# Patient Record
Sex: Female | Born: 1975 | Marital: Married | Smoking: Never smoker
Health system: Southern US, Community
[De-identification: ages and names within clinical notes are randomized; demographics above are authoritative.]

## PROBLEM LIST (undated history)

## (undated) DIAGNOSIS — E1140 Type 2 diabetes mellitus with diabetic neuropathy, unspecified: Secondary | ICD-10-CM

## (undated) DIAGNOSIS — E559 Vitamin D deficiency, unspecified: Secondary | ICD-10-CM

## (undated) DIAGNOSIS — E781 Pure hyperglyceridemia: Secondary | ICD-10-CM

## (undated) DIAGNOSIS — H269 Unspecified cataract: Secondary | ICD-10-CM

## (undated) DIAGNOSIS — D649 Anemia, unspecified: Secondary | ICD-10-CM

## (undated) DIAGNOSIS — E119 Type 2 diabetes mellitus without complications: Secondary | ICD-10-CM

## (undated) HISTORY — DX: Unspecified cataract: H26.9

## (undated) HISTORY — DX: Pure hyperglyceridemia: E78.1

## (undated) HISTORY — DX: Vitamin D deficiency, unspecified: E55.9

## (undated) HISTORY — DX: Type 2 diabetes mellitus without complications: E11.9

## (undated) HISTORY — DX: Morbid (severe) obesity due to excess calories: E66.01

## (undated) HISTORY — DX: Type 2 diabetes mellitus with diabetic neuropathy, unspecified: E11.40

## (undated) HISTORY — DX: Anemia, unspecified: D64.9

---

## 2015-08-02 DIAGNOSIS — L02211 Cutaneous abscess of abdominal wall: Secondary | ICD-10-CM | POA: Diagnosis not present

## 2015-08-02 DIAGNOSIS — Z6830 Body mass index (BMI) 30.0-30.9, adult: Secondary | ICD-10-CM | POA: Diagnosis not present

## 2015-08-02 DIAGNOSIS — L0231 Cutaneous abscess of buttock: Secondary | ICD-10-CM | POA: Diagnosis not present

## 2015-10-31 DIAGNOSIS — Z794 Long term (current) use of insulin: Secondary | ICD-10-CM | POA: Diagnosis not present

## 2015-10-31 DIAGNOSIS — E559 Vitamin D deficiency, unspecified: Secondary | ICD-10-CM | POA: Diagnosis not present

## 2015-10-31 DIAGNOSIS — E1129 Type 2 diabetes mellitus with other diabetic kidney complication: Secondary | ICD-10-CM | POA: Diagnosis not present

## 2015-10-31 DIAGNOSIS — E1165 Type 2 diabetes mellitus with hyperglycemia: Secondary | ICD-10-CM | POA: Diagnosis not present

## 2015-10-31 DIAGNOSIS — R809 Proteinuria, unspecified: Secondary | ICD-10-CM | POA: Diagnosis not present

## 2015-10-31 DIAGNOSIS — E785 Hyperlipidemia, unspecified: Secondary | ICD-10-CM | POA: Diagnosis not present

## 2015-10-31 DIAGNOSIS — I10 Essential (primary) hypertension: Secondary | ICD-10-CM | POA: Diagnosis not present

## 2015-11-20 DIAGNOSIS — E113393 Type 2 diabetes mellitus with moderate nonproliferative diabetic retinopathy without macular edema, bilateral: Secondary | ICD-10-CM | POA: Diagnosis not present

## 2015-11-20 DIAGNOSIS — H524 Presbyopia: Secondary | ICD-10-CM | POA: Diagnosis not present

## 2015-12-11 DIAGNOSIS — E1165 Type 2 diabetes mellitus with hyperglycemia: Secondary | ICD-10-CM | POA: Diagnosis not present

## 2015-12-26 DIAGNOSIS — H4312 Vitreous hemorrhage, left eye: Secondary | ICD-10-CM | POA: Diagnosis not present

## 2015-12-27 DIAGNOSIS — Z23 Encounter for immunization: Secondary | ICD-10-CM | POA: Diagnosis not present

## 2016-01-09 DIAGNOSIS — E113591 Type 2 diabetes mellitus with proliferative diabetic retinopathy without macular edema, right eye: Secondary | ICD-10-CM | POA: Diagnosis not present

## 2016-03-12 DIAGNOSIS — Z1389 Encounter for screening for other disorder: Secondary | ICD-10-CM | POA: Diagnosis not present

## 2016-03-12 DIAGNOSIS — J208 Acute bronchitis due to other specified organisms: Secondary | ICD-10-CM | POA: Diagnosis not present

## 2016-03-12 DIAGNOSIS — E669 Obesity, unspecified: Secondary | ICD-10-CM | POA: Diagnosis not present

## 2016-03-12 DIAGNOSIS — E1165 Type 2 diabetes mellitus with hyperglycemia: Secondary | ICD-10-CM | POA: Diagnosis not present

## 2016-03-12 DIAGNOSIS — R300 Dysuria: Secondary | ICD-10-CM | POA: Diagnosis not present

## 2016-03-16 DIAGNOSIS — E1129 Type 2 diabetes mellitus with other diabetic kidney complication: Secondary | ICD-10-CM | POA: Diagnosis not present

## 2016-03-16 DIAGNOSIS — E785 Hyperlipidemia, unspecified: Secondary | ICD-10-CM | POA: Diagnosis not present

## 2016-03-16 DIAGNOSIS — E1165 Type 2 diabetes mellitus with hyperglycemia: Secondary | ICD-10-CM | POA: Diagnosis not present

## 2016-03-16 DIAGNOSIS — E559 Vitamin D deficiency, unspecified: Secondary | ICD-10-CM | POA: Diagnosis not present

## 2016-03-16 DIAGNOSIS — Z794 Long term (current) use of insulin: Secondary | ICD-10-CM | POA: Diagnosis not present

## 2016-03-16 DIAGNOSIS — R809 Proteinuria, unspecified: Secondary | ICD-10-CM | POA: Diagnosis not present

## 2016-03-18 DIAGNOSIS — E1165 Type 2 diabetes mellitus with hyperglycemia: Secondary | ICD-10-CM | POA: Diagnosis not present

## 2016-04-14 DIAGNOSIS — J45909 Unspecified asthma, uncomplicated: Secondary | ICD-10-CM | POA: Diagnosis not present

## 2016-04-14 DIAGNOSIS — J309 Allergic rhinitis, unspecified: Secondary | ICD-10-CM | POA: Diagnosis not present

## 2016-04-14 DIAGNOSIS — N393 Stress incontinence (female) (male): Secondary | ICD-10-CM | POA: Diagnosis not present

## 2016-04-14 DIAGNOSIS — R5383 Other fatigue: Secondary | ICD-10-CM | POA: Diagnosis not present

## 2016-04-23 DIAGNOSIS — Z794 Long term (current) use of insulin: Secondary | ICD-10-CM | POA: Diagnosis not present

## 2016-04-23 DIAGNOSIS — H2513 Age-related nuclear cataract, bilateral: Secondary | ICD-10-CM | POA: Diagnosis not present

## 2016-04-23 DIAGNOSIS — E113513 Type 2 diabetes mellitus with proliferative diabetic retinopathy with macular edema, bilateral: Secondary | ICD-10-CM | POA: Diagnosis not present

## 2016-04-23 DIAGNOSIS — Z9889 Other specified postprocedural states: Secondary | ICD-10-CM | POA: Diagnosis not present

## 2016-04-23 DIAGNOSIS — H4312 Vitreous hemorrhage, left eye: Secondary | ICD-10-CM | POA: Diagnosis not present

## 2016-06-05 DIAGNOSIS — E559 Vitamin D deficiency, unspecified: Secondary | ICD-10-CM | POA: Diagnosis not present

## 2016-06-05 DIAGNOSIS — H2513 Age-related nuclear cataract, bilateral: Secondary | ICD-10-CM | POA: Diagnosis not present

## 2016-06-05 DIAGNOSIS — Z888 Allergy status to other drugs, medicaments and biological substances status: Secondary | ICD-10-CM | POA: Diagnosis not present

## 2016-06-05 DIAGNOSIS — Z79899 Other long term (current) drug therapy: Secondary | ICD-10-CM | POA: Diagnosis not present

## 2016-06-05 DIAGNOSIS — E113513 Type 2 diabetes mellitus with proliferative diabetic retinopathy with macular edema, bilateral: Secondary | ICD-10-CM | POA: Diagnosis not present

## 2016-06-05 DIAGNOSIS — H4312 Vitreous hemorrhage, left eye: Secondary | ICD-10-CM | POA: Diagnosis not present

## 2016-06-05 DIAGNOSIS — E785 Hyperlipidemia, unspecified: Secondary | ICD-10-CM | POA: Diagnosis not present

## 2016-06-05 DIAGNOSIS — Z794 Long term (current) use of insulin: Secondary | ICD-10-CM | POA: Diagnosis not present

## 2016-06-05 DIAGNOSIS — E113312 Type 2 diabetes mellitus with moderate nonproliferative diabetic retinopathy with macular edema, left eye: Secondary | ICD-10-CM | POA: Diagnosis not present

## 2016-07-03 DIAGNOSIS — E1165 Type 2 diabetes mellitus with hyperglycemia: Secondary | ICD-10-CM | POA: Diagnosis not present

## 2016-07-03 DIAGNOSIS — E1129 Type 2 diabetes mellitus with other diabetic kidney complication: Secondary | ICD-10-CM | POA: Diagnosis not present

## 2016-07-09 DIAGNOSIS — E1165 Type 2 diabetes mellitus with hyperglycemia: Secondary | ICD-10-CM | POA: Diagnosis not present

## 2016-07-09 DIAGNOSIS — E1129 Type 2 diabetes mellitus with other diabetic kidney complication: Secondary | ICD-10-CM | POA: Diagnosis not present

## 2016-07-13 DIAGNOSIS — R809 Proteinuria, unspecified: Secondary | ICD-10-CM | POA: Diagnosis not present

## 2016-07-13 DIAGNOSIS — E1129 Type 2 diabetes mellitus with other diabetic kidney complication: Secondary | ICD-10-CM | POA: Diagnosis not present

## 2016-07-13 DIAGNOSIS — Z794 Long term (current) use of insulin: Secondary | ICD-10-CM | POA: Diagnosis not present

## 2016-07-13 DIAGNOSIS — E1165 Type 2 diabetes mellitus with hyperglycemia: Secondary | ICD-10-CM | POA: Diagnosis not present

## 2016-07-13 DIAGNOSIS — E785 Hyperlipidemia, unspecified: Secondary | ICD-10-CM | POA: Diagnosis not present

## 2016-07-13 DIAGNOSIS — I10 Essential (primary) hypertension: Secondary | ICD-10-CM | POA: Diagnosis not present

## 2016-07-20 DIAGNOSIS — H31003 Unspecified chorioretinal scars, bilateral: Secondary | ICD-10-CM | POA: Diagnosis not present

## 2016-07-20 DIAGNOSIS — E1136 Type 2 diabetes mellitus with diabetic cataract: Secondary | ICD-10-CM | POA: Diagnosis not present

## 2016-07-20 DIAGNOSIS — H4312 Vitreous hemorrhage, left eye: Secondary | ICD-10-CM | POA: Diagnosis not present

## 2016-07-20 DIAGNOSIS — E785 Hyperlipidemia, unspecified: Secondary | ICD-10-CM | POA: Diagnosis not present

## 2016-07-20 DIAGNOSIS — Z888 Allergy status to other drugs, medicaments and biological substances status: Secondary | ICD-10-CM | POA: Diagnosis not present

## 2016-07-20 DIAGNOSIS — E113513 Type 2 diabetes mellitus with proliferative diabetic retinopathy with macular edema, bilateral: Secondary | ICD-10-CM | POA: Diagnosis not present

## 2016-07-20 DIAGNOSIS — H35051 Retinal neovascularization, unspecified, right eye: Secondary | ICD-10-CM | POA: Diagnosis not present

## 2016-07-20 DIAGNOSIS — Z794 Long term (current) use of insulin: Secondary | ICD-10-CM | POA: Diagnosis not present

## 2016-07-20 DIAGNOSIS — E113512 Type 2 diabetes mellitus with proliferative diabetic retinopathy with macular edema, left eye: Secondary | ICD-10-CM | POA: Diagnosis not present

## 2016-07-20 DIAGNOSIS — E113511 Type 2 diabetes mellitus with proliferative diabetic retinopathy with macular edema, right eye: Secondary | ICD-10-CM | POA: Diagnosis not present

## 2016-08-17 DIAGNOSIS — H4312 Vitreous hemorrhage, left eye: Secondary | ICD-10-CM | POA: Diagnosis not present

## 2016-08-17 DIAGNOSIS — Z794 Long term (current) use of insulin: Secondary | ICD-10-CM | POA: Diagnosis not present

## 2016-08-17 DIAGNOSIS — E113511 Type 2 diabetes mellitus with proliferative diabetic retinopathy with macular edema, right eye: Secondary | ICD-10-CM | POA: Diagnosis not present

## 2016-08-17 DIAGNOSIS — H2513 Age-related nuclear cataract, bilateral: Secondary | ICD-10-CM | POA: Diagnosis not present

## 2016-08-17 DIAGNOSIS — H35052 Retinal neovascularization, unspecified, left eye: Secondary | ICD-10-CM | POA: Diagnosis not present

## 2016-08-17 DIAGNOSIS — E113512 Type 2 diabetes mellitus with proliferative diabetic retinopathy with macular edema, left eye: Secondary | ICD-10-CM | POA: Diagnosis not present

## 2016-08-17 DIAGNOSIS — E113513 Type 2 diabetes mellitus with proliferative diabetic retinopathy with macular edema, bilateral: Secondary | ICD-10-CM | POA: Diagnosis not present

## 2016-09-14 DIAGNOSIS — Z794 Long term (current) use of insulin: Secondary | ICD-10-CM | POA: Diagnosis not present

## 2016-09-14 DIAGNOSIS — H2513 Age-related nuclear cataract, bilateral: Secondary | ICD-10-CM | POA: Diagnosis not present

## 2016-09-14 DIAGNOSIS — H35052 Retinal neovascularization, unspecified, left eye: Secondary | ICD-10-CM | POA: Diagnosis not present

## 2016-09-14 DIAGNOSIS — H4312 Vitreous hemorrhage, left eye: Secondary | ICD-10-CM | POA: Diagnosis not present

## 2016-09-14 DIAGNOSIS — E113512 Type 2 diabetes mellitus with proliferative diabetic retinopathy with macular edema, left eye: Secondary | ICD-10-CM | POA: Diagnosis not present

## 2016-09-14 DIAGNOSIS — E113513 Type 2 diabetes mellitus with proliferative diabetic retinopathy with macular edema, bilateral: Secondary | ICD-10-CM | POA: Diagnosis not present

## 2016-09-23 DIAGNOSIS — N926 Irregular menstruation, unspecified: Secondary | ICD-10-CM | POA: Diagnosis not present

## 2016-09-24 DIAGNOSIS — N926 Irregular menstruation, unspecified: Secondary | ICD-10-CM | POA: Diagnosis not present

## 2016-10-23 DIAGNOSIS — H4312 Vitreous hemorrhage, left eye: Secondary | ICD-10-CM | POA: Diagnosis not present

## 2016-10-23 DIAGNOSIS — Z794 Long term (current) use of insulin: Secondary | ICD-10-CM | POA: Diagnosis not present

## 2016-10-23 DIAGNOSIS — E113513 Type 2 diabetes mellitus with proliferative diabetic retinopathy with macular edema, bilateral: Secondary | ICD-10-CM | POA: Diagnosis not present

## 2016-10-23 DIAGNOSIS — H2513 Age-related nuclear cataract, bilateral: Secondary | ICD-10-CM | POA: Diagnosis not present

## 2016-10-23 DIAGNOSIS — E11311 Type 2 diabetes mellitus with unspecified diabetic retinopathy with macular edema: Secondary | ICD-10-CM | POA: Diagnosis not present

## 2016-11-02 DIAGNOSIS — E282 Polycystic ovarian syndrome: Secondary | ICD-10-CM | POA: Diagnosis not present

## 2016-11-02 DIAGNOSIS — E1165 Type 2 diabetes mellitus with hyperglycemia: Secondary | ICD-10-CM | POA: Diagnosis not present

## 2016-11-02 DIAGNOSIS — E1129 Type 2 diabetes mellitus with other diabetic kidney complication: Secondary | ICD-10-CM | POA: Diagnosis not present

## 2016-11-02 DIAGNOSIS — N926 Irregular menstruation, unspecified: Secondary | ICD-10-CM | POA: Diagnosis not present

## 2016-11-23 DIAGNOSIS — E113393 Type 2 diabetes mellitus with moderate nonproliferative diabetic retinopathy without macular edema, bilateral: Secondary | ICD-10-CM | POA: Diagnosis not present

## 2016-12-14 DIAGNOSIS — Z23 Encounter for immunization: Secondary | ICD-10-CM | POA: Diagnosis not present

## 2017-01-13 DIAGNOSIS — R809 Proteinuria, unspecified: Secondary | ICD-10-CM | POA: Diagnosis not present

## 2017-01-13 DIAGNOSIS — Z794 Long term (current) use of insulin: Secondary | ICD-10-CM | POA: Diagnosis not present

## 2017-01-13 DIAGNOSIS — E559 Vitamin D deficiency, unspecified: Secondary | ICD-10-CM | POA: Diagnosis not present

## 2017-01-13 DIAGNOSIS — E785 Hyperlipidemia, unspecified: Secondary | ICD-10-CM | POA: Diagnosis not present

## 2017-01-13 DIAGNOSIS — E1129 Type 2 diabetes mellitus with other diabetic kidney complication: Secondary | ICD-10-CM | POA: Diagnosis not present

## 2017-01-13 DIAGNOSIS — E1165 Type 2 diabetes mellitus with hyperglycemia: Secondary | ICD-10-CM | POA: Diagnosis not present

## 2017-01-27 DIAGNOSIS — E1129 Type 2 diabetes mellitus with other diabetic kidney complication: Secondary | ICD-10-CM | POA: Diagnosis not present

## 2017-01-27 DIAGNOSIS — L853 Xerosis cutis: Secondary | ICD-10-CM | POA: Diagnosis not present

## 2017-01-27 DIAGNOSIS — E1165 Type 2 diabetes mellitus with hyperglycemia: Secondary | ICD-10-CM | POA: Diagnosis not present

## 2017-01-27 DIAGNOSIS — E119 Type 2 diabetes mellitus without complications: Secondary | ICD-10-CM | POA: Diagnosis not present

## 2017-01-29 DIAGNOSIS — H2513 Age-related nuclear cataract, bilateral: Secondary | ICD-10-CM | POA: Diagnosis not present

## 2017-01-29 DIAGNOSIS — E113513 Type 2 diabetes mellitus with proliferative diabetic retinopathy with macular edema, bilateral: Secondary | ICD-10-CM | POA: Diagnosis not present

## 2017-01-29 DIAGNOSIS — H4312 Vitreous hemorrhage, left eye: Secondary | ICD-10-CM | POA: Diagnosis not present

## 2017-03-30 DIAGNOSIS — E1129 Type 2 diabetes mellitus with other diabetic kidney complication: Secondary | ICD-10-CM | POA: Diagnosis not present

## 2017-03-30 DIAGNOSIS — E1165 Type 2 diabetes mellitus with hyperglycemia: Secondary | ICD-10-CM | POA: Diagnosis not present

## 2017-04-16 DIAGNOSIS — E1129 Type 2 diabetes mellitus with other diabetic kidney complication: Secondary | ICD-10-CM | POA: Diagnosis not present

## 2017-04-16 DIAGNOSIS — Z794 Long term (current) use of insulin: Secondary | ICD-10-CM | POA: Diagnosis not present

## 2017-04-16 DIAGNOSIS — R809 Proteinuria, unspecified: Secondary | ICD-10-CM | POA: Diagnosis not present

## 2017-04-16 DIAGNOSIS — E785 Hyperlipidemia, unspecified: Secondary | ICD-10-CM | POA: Diagnosis not present

## 2017-04-16 DIAGNOSIS — E559 Vitamin D deficiency, unspecified: Secondary | ICD-10-CM | POA: Diagnosis not present

## 2017-04-16 DIAGNOSIS — E1165 Type 2 diabetes mellitus with hyperglycemia: Secondary | ICD-10-CM | POA: Diagnosis not present

## 2017-05-18 DIAGNOSIS — M6281 Muscle weakness (generalized): Secondary | ICD-10-CM | POA: Diagnosis not present

## 2017-05-18 DIAGNOSIS — M545 Low back pain: Secondary | ICD-10-CM | POA: Diagnosis not present

## 2017-05-18 DIAGNOSIS — M542 Cervicalgia: Secondary | ICD-10-CM | POA: Diagnosis not present

## 2017-05-18 DIAGNOSIS — R2689 Other abnormalities of gait and mobility: Secondary | ICD-10-CM | POA: Diagnosis not present

## 2017-05-21 DIAGNOSIS — R2689 Other abnormalities of gait and mobility: Secondary | ICD-10-CM | POA: Diagnosis not present

## 2017-05-21 DIAGNOSIS — M6281 Muscle weakness (generalized): Secondary | ICD-10-CM | POA: Diagnosis not present

## 2017-05-21 DIAGNOSIS — M545 Low back pain: Secondary | ICD-10-CM | POA: Diagnosis not present

## 2017-05-21 DIAGNOSIS — M542 Cervicalgia: Secondary | ICD-10-CM | POA: Diagnosis not present

## 2017-05-24 DIAGNOSIS — Z6832 Body mass index (BMI) 32.0-32.9, adult: Secondary | ICD-10-CM | POA: Diagnosis not present

## 2017-05-24 DIAGNOSIS — J019 Acute sinusitis, unspecified: Secondary | ICD-10-CM | POA: Diagnosis not present

## 2017-05-24 DIAGNOSIS — E663 Overweight: Secondary | ICD-10-CM | POA: Diagnosis not present

## 2017-05-24 DIAGNOSIS — J029 Acute pharyngitis, unspecified: Secondary | ICD-10-CM | POA: Diagnosis not present

## 2017-06-03 DIAGNOSIS — H4312 Vitreous hemorrhage, left eye: Secondary | ICD-10-CM | POA: Diagnosis not present

## 2017-06-03 DIAGNOSIS — E113512 Type 2 diabetes mellitus with proliferative diabetic retinopathy with macular edema, left eye: Secondary | ICD-10-CM | POA: Diagnosis not present

## 2017-06-03 DIAGNOSIS — E11311 Type 2 diabetes mellitus with unspecified diabetic retinopathy with macular edema: Secondary | ICD-10-CM | POA: Diagnosis not present

## 2017-06-03 DIAGNOSIS — E113513 Type 2 diabetes mellitus with proliferative diabetic retinopathy with macular edema, bilateral: Secondary | ICD-10-CM | POA: Diagnosis not present

## 2017-06-03 DIAGNOSIS — H35371 Puckering of macula, right eye: Secondary | ICD-10-CM | POA: Diagnosis not present

## 2017-06-03 DIAGNOSIS — H2513 Age-related nuclear cataract, bilateral: Secondary | ICD-10-CM | POA: Diagnosis not present

## 2017-06-03 DIAGNOSIS — Z794 Long term (current) use of insulin: Secondary | ICD-10-CM | POA: Diagnosis not present

## 2017-06-10 DIAGNOSIS — J029 Acute pharyngitis, unspecified: Secondary | ICD-10-CM | POA: Diagnosis not present

## 2017-06-10 DIAGNOSIS — Z6832 Body mass index (BMI) 32.0-32.9, adult: Secondary | ICD-10-CM | POA: Diagnosis not present

## 2017-06-10 DIAGNOSIS — R300 Dysuria: Secondary | ICD-10-CM | POA: Diagnosis not present

## 2017-06-10 DIAGNOSIS — E663 Overweight: Secondary | ICD-10-CM | POA: Diagnosis not present

## 2017-07-08 DIAGNOSIS — H4312 Vitreous hemorrhage, left eye: Secondary | ICD-10-CM | POA: Diagnosis not present

## 2017-07-08 DIAGNOSIS — Z888 Allergy status to other drugs, medicaments and biological substances status: Secondary | ICD-10-CM | POA: Diagnosis not present

## 2017-07-08 DIAGNOSIS — H2513 Age-related nuclear cataract, bilateral: Secondary | ICD-10-CM | POA: Diagnosis not present

## 2017-07-08 DIAGNOSIS — Z794 Long term (current) use of insulin: Secondary | ICD-10-CM | POA: Diagnosis not present

## 2017-07-08 DIAGNOSIS — E113513 Type 2 diabetes mellitus with proliferative diabetic retinopathy with macular edema, bilateral: Secondary | ICD-10-CM | POA: Diagnosis not present

## 2017-07-08 DIAGNOSIS — E11311 Type 2 diabetes mellitus with unspecified diabetic retinopathy with macular edema: Secondary | ICD-10-CM | POA: Diagnosis not present

## 2017-07-16 DIAGNOSIS — E785 Hyperlipidemia, unspecified: Secondary | ICD-10-CM | POA: Diagnosis not present

## 2017-07-16 DIAGNOSIS — E1129 Type 2 diabetes mellitus with other diabetic kidney complication: Secondary | ICD-10-CM | POA: Diagnosis not present

## 2017-07-16 DIAGNOSIS — E1165 Type 2 diabetes mellitus with hyperglycemia: Secondary | ICD-10-CM | POA: Diagnosis not present

## 2017-07-16 DIAGNOSIS — R809 Proteinuria, unspecified: Secondary | ICD-10-CM | POA: Diagnosis not present

## 2017-08-19 DIAGNOSIS — E113513 Type 2 diabetes mellitus with proliferative diabetic retinopathy with macular edema, bilateral: Secondary | ICD-10-CM | POA: Diagnosis not present

## 2017-08-19 DIAGNOSIS — Z794 Long term (current) use of insulin: Secondary | ICD-10-CM | POA: Diagnosis not present

## 2017-08-19 DIAGNOSIS — H4312 Vitreous hemorrhage, left eye: Secondary | ICD-10-CM | POA: Diagnosis not present

## 2017-08-19 DIAGNOSIS — H2513 Age-related nuclear cataract, bilateral: Secondary | ICD-10-CM | POA: Diagnosis not present

## 2017-08-19 DIAGNOSIS — E11311 Type 2 diabetes mellitus with unspecified diabetic retinopathy with macular edema: Secondary | ICD-10-CM | POA: Diagnosis not present

## 2017-09-06 DIAGNOSIS — J069 Acute upper respiratory infection, unspecified: Secondary | ICD-10-CM | POA: Diagnosis not present

## 2017-09-06 DIAGNOSIS — E663 Overweight: Secondary | ICD-10-CM | POA: Diagnosis not present

## 2017-09-06 DIAGNOSIS — Z6833 Body mass index (BMI) 33.0-33.9, adult: Secondary | ICD-10-CM | POA: Diagnosis not present

## 2017-10-08 DIAGNOSIS — E113512 Type 2 diabetes mellitus with proliferative diabetic retinopathy with macular edema, left eye: Secondary | ICD-10-CM | POA: Diagnosis not present

## 2017-10-08 DIAGNOSIS — E113513 Type 2 diabetes mellitus with proliferative diabetic retinopathy with macular edema, bilateral: Secondary | ICD-10-CM | POA: Diagnosis not present

## 2017-11-11 DIAGNOSIS — E1165 Type 2 diabetes mellitus with hyperglycemia: Secondary | ICD-10-CM | POA: Diagnosis not present

## 2017-11-11 DIAGNOSIS — Z794 Long term (current) use of insulin: Secondary | ICD-10-CM | POA: Diagnosis not present

## 2017-11-11 DIAGNOSIS — E1129 Type 2 diabetes mellitus with other diabetic kidney complication: Secondary | ICD-10-CM | POA: Diagnosis not present

## 2017-11-11 DIAGNOSIS — R809 Proteinuria, unspecified: Secondary | ICD-10-CM | POA: Diagnosis not present

## 2017-11-24 DIAGNOSIS — E1129 Type 2 diabetes mellitus with other diabetic kidney complication: Secondary | ICD-10-CM | POA: Diagnosis not present

## 2017-11-24 DIAGNOSIS — E1165 Type 2 diabetes mellitus with hyperglycemia: Secondary | ICD-10-CM | POA: Diagnosis not present

## 2017-12-10 DIAGNOSIS — H35371 Puckering of macula, right eye: Secondary | ICD-10-CM | POA: Diagnosis not present

## 2017-12-10 DIAGNOSIS — E113512 Type 2 diabetes mellitus with proliferative diabetic retinopathy with macular edema, left eye: Secondary | ICD-10-CM | POA: Diagnosis not present

## 2017-12-10 DIAGNOSIS — Z794 Long term (current) use of insulin: Secondary | ICD-10-CM | POA: Diagnosis not present

## 2017-12-10 DIAGNOSIS — E113591 Type 2 diabetes mellitus with proliferative diabetic retinopathy without macular edema, right eye: Secondary | ICD-10-CM | POA: Diagnosis not present

## 2018-01-04 DIAGNOSIS — Z23 Encounter for immunization: Secondary | ICD-10-CM | POA: Diagnosis not present

## 2018-01-25 DIAGNOSIS — Z794 Long term (current) use of insulin: Secondary | ICD-10-CM | POA: Diagnosis not present

## 2018-01-25 DIAGNOSIS — E113513 Type 2 diabetes mellitus with proliferative diabetic retinopathy with macular edema, bilateral: Secondary | ICD-10-CM | POA: Diagnosis not present

## 2018-01-25 DIAGNOSIS — H35371 Puckering of macula, right eye: Secondary | ICD-10-CM | POA: Diagnosis not present

## 2018-01-28 DIAGNOSIS — Z6833 Body mass index (BMI) 33.0-33.9, adult: Secondary | ICD-10-CM | POA: Diagnosis not present

## 2018-01-28 DIAGNOSIS — R300 Dysuria: Secondary | ICD-10-CM | POA: Diagnosis not present

## 2018-01-28 DIAGNOSIS — N760 Acute vaginitis: Secondary | ICD-10-CM | POA: Diagnosis not present

## 2018-02-10 DIAGNOSIS — E1165 Type 2 diabetes mellitus with hyperglycemia: Secondary | ICD-10-CM | POA: Diagnosis not present

## 2018-02-10 DIAGNOSIS — E1129 Type 2 diabetes mellitus with other diabetic kidney complication: Secondary | ICD-10-CM | POA: Diagnosis not present

## 2018-02-21 DIAGNOSIS — E113393 Type 2 diabetes mellitus with moderate nonproliferative diabetic retinopathy without macular edema, bilateral: Secondary | ICD-10-CM | POA: Diagnosis not present

## 2018-03-23 DIAGNOSIS — Z6833 Body mass index (BMI) 33.0-33.9, adult: Secondary | ICD-10-CM | POA: Diagnosis not present

## 2018-03-23 DIAGNOSIS — R509 Fever, unspecified: Secondary | ICD-10-CM | POA: Diagnosis not present

## 2018-03-23 DIAGNOSIS — J029 Acute pharyngitis, unspecified: Secondary | ICD-10-CM | POA: Diagnosis not present

## 2018-04-08 DIAGNOSIS — E113513 Type 2 diabetes mellitus with proliferative diabetic retinopathy with macular edema, bilateral: Secondary | ICD-10-CM | POA: Diagnosis not present

## 2018-04-19 DIAGNOSIS — E1129 Type 2 diabetes mellitus with other diabetic kidney complication: Secondary | ICD-10-CM | POA: Diagnosis not present

## 2018-04-19 DIAGNOSIS — E1165 Type 2 diabetes mellitus with hyperglycemia: Secondary | ICD-10-CM | POA: Diagnosis not present

## 2018-04-19 DIAGNOSIS — R05 Cough: Secondary | ICD-10-CM | POA: Diagnosis not present

## 2018-04-19 DIAGNOSIS — E1139 Type 2 diabetes mellitus with other diabetic ophthalmic complication: Secondary | ICD-10-CM | POA: Diagnosis not present

## 2018-05-02 DIAGNOSIS — J309 Allergic rhinitis, unspecified: Secondary | ICD-10-CM | POA: Diagnosis not present

## 2018-05-02 DIAGNOSIS — R05 Cough: Secondary | ICD-10-CM | POA: Diagnosis not present

## 2018-05-02 DIAGNOSIS — E1140 Type 2 diabetes mellitus with diabetic neuropathy, unspecified: Secondary | ICD-10-CM | POA: Diagnosis not present

## 2018-05-02 DIAGNOSIS — F419 Anxiety disorder, unspecified: Secondary | ICD-10-CM | POA: Diagnosis not present

## 2018-05-06 DIAGNOSIS — I10 Essential (primary) hypertension: Secondary | ICD-10-CM | POA: Diagnosis not present

## 2018-05-06 DIAGNOSIS — E559 Vitamin D deficiency, unspecified: Secondary | ICD-10-CM | POA: Diagnosis not present

## 2018-05-06 DIAGNOSIS — E113513 Type 2 diabetes mellitus with proliferative diabetic retinopathy with macular edema, bilateral: Secondary | ICD-10-CM | POA: Diagnosis not present

## 2018-05-06 DIAGNOSIS — E1129 Type 2 diabetes mellitus with other diabetic kidney complication: Secondary | ICD-10-CM | POA: Diagnosis not present

## 2018-05-06 DIAGNOSIS — E782 Mixed hyperlipidemia: Secondary | ICD-10-CM | POA: Diagnosis not present

## 2018-05-06 DIAGNOSIS — E1165 Type 2 diabetes mellitus with hyperglycemia: Secondary | ICD-10-CM | POA: Diagnosis not present

## 2018-05-12 DIAGNOSIS — E1129 Type 2 diabetes mellitus with other diabetic kidney complication: Secondary | ICD-10-CM | POA: Diagnosis not present

## 2018-05-12 DIAGNOSIS — E1165 Type 2 diabetes mellitus with hyperglycemia: Secondary | ICD-10-CM | POA: Diagnosis not present

## 2018-07-06 DIAGNOSIS — E1129 Type 2 diabetes mellitus with other diabetic kidney complication: Secondary | ICD-10-CM | POA: Diagnosis not present

## 2018-07-06 DIAGNOSIS — E1165 Type 2 diabetes mellitus with hyperglycemia: Secondary | ICD-10-CM | POA: Diagnosis not present

## 2018-07-06 DIAGNOSIS — I10 Essential (primary) hypertension: Secondary | ICD-10-CM | POA: Diagnosis not present

## 2018-07-06 DIAGNOSIS — E113513 Type 2 diabetes mellitus with proliferative diabetic retinopathy with macular edema, bilateral: Secondary | ICD-10-CM | POA: Diagnosis not present

## 2018-07-22 DIAGNOSIS — M545 Low back pain: Secondary | ICD-10-CM | POA: Diagnosis not present

## 2018-07-22 DIAGNOSIS — R509 Fever, unspecified: Secondary | ICD-10-CM | POA: Diagnosis not present

## 2018-07-27 DIAGNOSIS — R319 Hematuria, unspecified: Secondary | ICD-10-CM | POA: Diagnosis not present

## 2018-07-27 DIAGNOSIS — R109 Unspecified abdominal pain: Secondary | ICD-10-CM | POA: Diagnosis not present

## 2018-07-29 DIAGNOSIS — N838 Other noninflammatory disorders of ovary, fallopian tube and broad ligament: Secondary | ICD-10-CM | POA: Diagnosis not present

## 2018-07-29 DIAGNOSIS — K760 Fatty (change of) liver, not elsewhere classified: Secondary | ICD-10-CM | POA: Diagnosis not present

## 2018-08-04 DIAGNOSIS — Z124 Encounter for screening for malignant neoplasm of cervix: Secondary | ICD-10-CM | POA: Diagnosis not present

## 2018-08-04 DIAGNOSIS — R1900 Intra-abdominal and pelvic swelling, mass and lump, unspecified site: Secondary | ICD-10-CM | POA: Diagnosis not present

## 2018-08-04 DIAGNOSIS — Z1239 Encounter for other screening for malignant neoplasm of breast: Secondary | ICD-10-CM | POA: Diagnosis not present

## 2018-08-04 DIAGNOSIS — Z1151 Encounter for screening for human papillomavirus (HPV): Secondary | ICD-10-CM | POA: Diagnosis not present

## 2018-08-15 DIAGNOSIS — R1904 Left lower quadrant abdominal swelling, mass and lump: Secondary | ICD-10-CM | POA: Diagnosis not present

## 2018-08-15 DIAGNOSIS — R1900 Intra-abdominal and pelvic swelling, mass and lump, unspecified site: Secondary | ICD-10-CM | POA: Diagnosis not present

## 2018-08-15 DIAGNOSIS — R971 Elevated cancer antigen 125 [CA 125]: Secondary | ICD-10-CM | POA: Diagnosis not present

## 2019-04-21 ENCOUNTER — Ambulatory Visit: Payer: Self-pay

## 2020-02-22 ENCOUNTER — Other Ambulatory Visit: Payer: Self-pay | Admitting: Podiatry

## 2020-02-22 ENCOUNTER — Ambulatory Visit: Payer: BC Managed Care – PPO | Admitting: Podiatry

## 2020-02-22 ENCOUNTER — Other Ambulatory Visit: Payer: Self-pay

## 2020-02-22 ENCOUNTER — Encounter: Payer: Self-pay | Admitting: Podiatry

## 2020-02-22 ENCOUNTER — Ambulatory Visit (INDEPENDENT_AMBULATORY_CARE_PROVIDER_SITE_OTHER): Payer: BC Managed Care – PPO

## 2020-02-22 DIAGNOSIS — L02619 Cutaneous abscess of unspecified foot: Secondary | ICD-10-CM

## 2020-02-22 DIAGNOSIS — L03119 Cellulitis of unspecified part of limb: Secondary | ICD-10-CM

## 2020-02-22 DIAGNOSIS — M79671 Pain in right foot: Secondary | ICD-10-CM | POA: Diagnosis not present

## 2020-02-22 DIAGNOSIS — L02612 Cutaneous abscess of left foot: Secondary | ICD-10-CM

## 2020-02-22 DIAGNOSIS — L03032 Cellulitis of left toe: Secondary | ICD-10-CM | POA: Diagnosis not present

## 2020-02-22 DIAGNOSIS — M79672 Pain in left foot: Secondary | ICD-10-CM | POA: Diagnosis not present

## 2020-02-22 DIAGNOSIS — M2042 Other hammer toe(s) (acquired), left foot: Secondary | ICD-10-CM | POA: Diagnosis not present

## 2020-02-22 MED ORDER — CEPHALEXIN 500 MG PO CAPS: 500.0000 mg | ORAL_CAPSULE | Freq: Three times a day (TID) | ORAL | 1 refills | Status: DC

## 2020-02-22 MED ORDER — CIPROFLOXACIN HCL 500 MG PO TABS: 500.0000 mg | ORAL_TABLET | Freq: Two times a day (BID) | ORAL | 0 refills | Status: DC

## 2020-02-22 MED ORDER — AMOXICILLIN-POT CLAVULANATE 875-125 MG PO TABS: 1.0000 | ORAL_TABLET | Freq: Two times a day (BID) | ORAL | 0 refills | Status: DC

## 2020-02-22 NOTE — Progress Notes (Signed)
Subjective:   Patient ID: Ashley Kane, female   DOB: 44 y.o.   MRN: [id number]   HPI Patient presents stating she wore a tight shoe and developed an abrasion of the fifth toe left foot and then she started develop some redness in her forefoot localized and is not sure what to do.  States that she has had long-term diabetes 20 years and her last A1c was approximate 9 and does not smoke likes to be active if possible   Review of Systems  All other systems reviewed and are negative.       Objective:  Physical Exam Vitals and nursing note reviewed.  Constitutional:      Appearance: She is well-developed and well-nourished.  Cardiovascular:     Pulses: Intact distal pulses.  Pulmonary:     Effort: Pulmonary effort is normal.  Musculoskeletal:        General: Normal range of motion.  Skin:    General: Skin is warm.  Neurological:     Mental Status: She is alert.     Neuro vascular status found to be mildly diminished as far as sharp dull vibratory with circulatory status intact.  Patient does have in the left foot moderate breakdown around the fifth toe with no active drainage noted some crusted tissue on the outside and then some forefoot swelling left centered around the fifth metatarsal with no proximal edema erythema or drainage noted.  It is painful to a moderate nature around this area and patient does have good digital perfusion well oriented x3     Assessment:  Probability that were dealing with a localized infective process with patient checking her temperature and always find it to be normal and has not had spikes in her sugar.  She does have some breakdown around the fifth toe but is localized crusted over with no active drainage currently     Plan:  H&P x-ray reviewed condition discussed and again a place her on combination of Augmentin and Cipro and I gave her strict instructions on soaks daily inspections of any changes were to occur to go straight to the emergency  room and I did discuss with her the strong possibility that at 1 point amputation may be necessary.  Patient will be seen back to recheck and is encouraged to call with questions concerns  X-ray indicates no signs so far of osteolysis around the toe fifth left

## 2020-02-27 ENCOUNTER — Ambulatory Visit: Payer: Self-pay | Admitting: Podiatry

## 2020-02-28 ENCOUNTER — Ambulatory Visit: Payer: 59 | Admitting: Podiatry

## 2020-02-29 ENCOUNTER — Other Ambulatory Visit: Payer: Self-pay

## 2020-02-29 ENCOUNTER — Encounter: Payer: Self-pay | Admitting: Podiatry

## 2020-02-29 ENCOUNTER — Ambulatory Visit (INDEPENDENT_AMBULATORY_CARE_PROVIDER_SITE_OTHER): Payer: BC Managed Care – PPO

## 2020-02-29 ENCOUNTER — Ambulatory Visit: Payer: 59 | Admitting: Podiatry

## 2020-02-29 VITALS — Temp 98.3°F

## 2020-02-29 DIAGNOSIS — L03032 Cellulitis of left toe: Secondary | ICD-10-CM

## 2020-02-29 DIAGNOSIS — L02612 Cutaneous abscess of left foot: Secondary | ICD-10-CM

## 2020-02-29 DIAGNOSIS — M79671 Pain in right foot: Secondary | ICD-10-CM | POA: Diagnosis not present

## 2020-02-29 DIAGNOSIS — M79672 Pain in left foot: Secondary | ICD-10-CM | POA: Diagnosis not present

## 2020-02-29 MED ORDER — CIPROFLOXACIN HCL 500 MG PO TABS: 500.0000 mg | ORAL_TABLET | Freq: Two times a day (BID) | ORAL | 0 refills | Status: DC

## 2020-02-29 MED ORDER — AMOXICILLIN-POT CLAVULANATE 875-125 MG PO TABS: 1.0000 | ORAL_TABLET | Freq: Two times a day (BID) | ORAL | 0 refills | Status: DC

## 2020-02-29 NOTE — Progress Notes (Signed)
Subjective:   Patient ID: Ashley Kane, female   DOB: 44 y.o.   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  I debrided tissue I flushed the area applied Iodosorb sterile dressing instructed on soaks open toed shoes we will continue Augmentin and Cipro and patient will be seen back 2 weeks and I made her very aware today that the chances for amputation of this fifth digit are very high.  Also I explained what to do if any systemic signs of infection were to occur and to go straight to the emergency room for IV antibiotics  X-rays indicate that there is changes in the proximal phalanx digit 5 left head of proximal phalanx

## 2020-03-11 ENCOUNTER — Ambulatory Visit: Payer: BC Managed Care – PPO | Admitting: Podiatry

## 2020-03-11 ENCOUNTER — Other Ambulatory Visit: Payer: Self-pay

## 2020-03-11 ENCOUNTER — Ambulatory Visit (INDEPENDENT_AMBULATORY_CARE_PROVIDER_SITE_OTHER): Payer: BC Managed Care – PPO

## 2020-03-11 DIAGNOSIS — L03032 Cellulitis of left toe: Secondary | ICD-10-CM | POA: Diagnosis not present

## 2020-03-11 DIAGNOSIS — M86172 Other acute osteomyelitis, left ankle and foot: Secondary | ICD-10-CM | POA: Diagnosis not present

## 2020-03-11 DIAGNOSIS — L02612 Cutaneous abscess of left foot: Secondary | ICD-10-CM

## 2020-03-11 MED ORDER — AMOXICILLIN-POT CLAVULANATE 875-125 MG PO TABS: 1.0000 | ORAL_TABLET | Freq: Two times a day (BID) | ORAL | 0 refills | Status: DC

## 2020-03-12 NOTE — Progress Notes (Signed)
Subjective:   Patient ID: Ashley Kane, female   DOB: 45 y.o.   MRN: [id number]   HPI Patient states that she seems to be doing pretty well but states that the toe still does drain some and there is still some swelling in the forefoot   ROS      Objective:  Physical Exam  Neurovascular status unchanged with patient found to have a neuropathic condition and ulceration of the left fifth digit which has persisted with reduction of the redness reduction of the swelling but still some present     Assessment:  Low-grade breakdown of tissue fifth digit left with history of inappropriate shoe gear that has slowly improved but still gives her problems     Plan:  H&P x-ray reviewed and again at great length discussed the strong possibility for digital amputation but is we are showing slight improvement in the x-ray is stable we will continue treated conservatively and today I debrided out tissue I flushed the wound and applied Iodosorb sterile dressing I instructed on home soaks open toed shoes and continued Augmentin usage.  Encouraged her to call with questions concerns and she will need to go to the hospital if any systemic signs of infection were to occur and again does understand absolutely that the strong possibility is there for long-term amputation with long-term diabetic neuropathy  X-rays indicate no progression of the osteomyelitis currently but this will have to be watched and still strong possibility for amputation

## 2020-03-14 ENCOUNTER — Ambulatory Visit: Payer: 59 | Admitting: Podiatry

## 2020-03-27 ENCOUNTER — Ambulatory Visit (INDEPENDENT_AMBULATORY_CARE_PROVIDER_SITE_OTHER): Payer: BC Managed Care – PPO

## 2020-03-27 ENCOUNTER — Ambulatory Visit: Payer: 59 | Admitting: Podiatry

## 2020-03-27 ENCOUNTER — Other Ambulatory Visit: Payer: Self-pay

## 2020-03-27 DIAGNOSIS — L03032 Cellulitis of left toe: Secondary | ICD-10-CM | POA: Diagnosis not present

## 2020-03-27 DIAGNOSIS — L02612 Cutaneous abscess of left foot: Secondary | ICD-10-CM

## 2020-03-27 DIAGNOSIS — R600 Localized edema: Secondary | ICD-10-CM | POA: Diagnosis not present

## 2020-03-28 NOTE — Progress Notes (Signed)
Subjective:   Patient ID: Ashley Kane, female   DOB: 45 y.o.   MRN: [id number]   HPI Patient presents stating that she seems to slowly be improving but she continues to have swelling in her foot and she is not having as much pain but she would like to start wearing regular shoes and stopped antibiotics 3 days ago   ROS      Objective:  Physical Exam  Neurovascular status intact sugars been running very good with normal fasting sugars with patient noted to have continued keratotic tissue formation no active drainage with low-grade abscess of the left fifth digit with no proximal erythema or calor noted proximal.  Patient has no systemic signs of infection     Assessment:  Long-term diabetic with chronic lesion left fifth digit they were continuing to work on it and I am hopeful that ultimately we can get this to heal with right without requiring amputation even though still a distinct possibility     Plan:  Sterile debridement of the area flushed the area applied Iodosorb with sterile dressing to the left fifth toe.  I did apply Unna boot to try to reduce the swelling and I advised her on elevation and I want to see her back again in 3 weeks and I gave strict instructions of any increased redness drainage swelling or any other pathology were to occur to let us know immediately and we are in a try to go without the antibiotics and see how she responds  X-rays indicate that there is the continued lucency of the head of the proximal phalanx fifth digit left but I am not seeing a significant change versus the previous x-rays

## 2020-04-18 ENCOUNTER — Other Ambulatory Visit: Payer: Self-pay

## 2020-04-18 ENCOUNTER — Encounter: Payer: Self-pay | Admitting: Podiatry

## 2020-04-18 ENCOUNTER — Ambulatory Visit: Payer: 59 | Admitting: Podiatry

## 2020-04-18 ENCOUNTER — Ambulatory Visit (INDEPENDENT_AMBULATORY_CARE_PROVIDER_SITE_OTHER): Payer: BC Managed Care – PPO

## 2020-04-18 DIAGNOSIS — M86172 Other acute osteomyelitis, left ankle and foot: Secondary | ICD-10-CM

## 2020-04-18 DIAGNOSIS — L03032 Cellulitis of left toe: Secondary | ICD-10-CM

## 2020-04-18 DIAGNOSIS — L02612 Cutaneous abscess of left foot: Secondary | ICD-10-CM | POA: Diagnosis not present

## 2020-04-19 NOTE — Progress Notes (Signed)
Subjective:   Patient ID: Ashley Kane, female   DOB: 45 y.o.   MRN: [id number]   HPI Patient states the toe seems to be doing better but I still get some irritation with it and I am trying not to wear tight shoes as best as possible   ROS      Objective:  Physical Exam  Neurovascular status is stable with patient's fifth digit left looking the best I have seen it so far with discoloration of the toe slight bleeding but no current drainage and tissue which appears to have covered the area that had been ulcerated for a substantial period of time     Assessment:  Most hopeful I am that we have been able to save the fifth digit with the foot itself also looking much better with minimal swelling occurring within the foot now no erythema within the foot itself or color changes     Plan:  H&P reviewed condition and recommended we continue with conservative care and no antibiotics to see how she responds even though I explained that if it were to start draining again or if signs of infection were to occur then the digits can need to be amputated.  I reviewed her x-rays and get a see her back again in a month and I am very hopeful that we may have gotten past this crisis and she will continue to work at keeping her sugar under control  X-rays indicate that there has been some reactive changes around the head of the proximal phalanx fifth left but it is stable from previous visits

## 2020-05-16 ENCOUNTER — Ambulatory Visit: Payer: 59 | Admitting: Podiatry

## 2020-05-16 ENCOUNTER — Encounter: Payer: Self-pay | Admitting: Podiatry

## 2020-05-16 ENCOUNTER — Other Ambulatory Visit: Payer: Self-pay

## 2020-05-16 DIAGNOSIS — M2042 Other hammer toe(s) (acquired), left foot: Secondary | ICD-10-CM | POA: Diagnosis not present

## 2020-05-16 DIAGNOSIS — L02612 Cutaneous abscess of left foot: Secondary | ICD-10-CM

## 2020-05-16 DIAGNOSIS — M86172 Other acute osteomyelitis, left ankle and foot: Secondary | ICD-10-CM

## 2020-05-16 DIAGNOSIS — L03032 Cellulitis of left toe: Secondary | ICD-10-CM | POA: Diagnosis not present

## 2020-05-18 NOTE — Progress Notes (Signed)
Subjective:   Patient ID: Ashley Kane, female   DOB: 45 y.o.   MRN: [id number]   HPI Patient states doing much better with the toe and it is no longer draining and she feels like this is the best it has been in a long time and she is trying much harder to take good care of her diabetes   ROS      Objective:  Physical Exam  Neurovascular status intact with patient's left fifth digit doing much better with no current drainage slight crusted tissue discoloration with no edema into the forefoot or other pathology     Assessment:  Appears to be doing much better from having had a infection of a significant nature left fifth digit with possibility for bony involvement     Plan:  H&P reviewed at great detail the condition and the fact that there is still may be pathology and ultimately digital or metatarsal amputation may be necessary.  At this point I went ahead and I recommended the continuation of wider shoes mesh material and daily inspections along with good control of the diabetes.  Patient's discharge reappoint as needed

## 2020-08-30 ENCOUNTER — Other Ambulatory Visit: Payer: Self-pay | Admitting: Family Medicine

## 2020-08-30 DIAGNOSIS — Z1231 Encounter for screening mammogram for malignant neoplasm of breast: Secondary | ICD-10-CM

## 2020-09-18 ENCOUNTER — Other Ambulatory Visit: Payer: Self-pay

## 2020-09-18 ENCOUNTER — Ambulatory Visit
Admission: RE | Admit: 2020-09-18 | Discharge: 2020-09-18 | Disposition: A | Discharge status: Home / Self Care | Payer: BC Managed Care – PPO | Source: Ambulatory Visit | Attending: Family Medicine | Admitting: Family Medicine

## 2020-09-18 DIAGNOSIS — Z1231 Encounter for screening mammogram for malignant neoplasm of breast: Secondary | ICD-10-CM

## 2021-10-30 DIAGNOSIS — R600 Localized edema: Secondary | ICD-10-CM

## 2021-12-25 ENCOUNTER — Other Ambulatory Visit (HOSPITAL_COMMUNITY): Payer: Self-pay

## 2021-12-25 ENCOUNTER — Other Ambulatory Visit (HOSPITAL_BASED_OUTPATIENT_CLINIC_OR_DEPARTMENT_OTHER): Payer: Self-pay

## 2021-12-25 MED ORDER — OZEMPIC (2 MG/DOSE) 8 MG/3ML ~~LOC~~ SOPN
PEN_INJECTOR | SUBCUTANEOUS | 0 refills | Status: DC
  Filled 2021-12-25 (×2): qty 3, 28d supply, fill #0
  Filled 2022-01-26: qty 3, 28d supply, fill #1
  Filled 2022-03-21: qty 3, 28d supply, fill #2

## 2021-12-30 ENCOUNTER — Encounter: Payer: Self-pay | Admitting: Gastroenterology

## 2021-12-30 ENCOUNTER — Other Ambulatory Visit (INDEPENDENT_AMBULATORY_CARE_PROVIDER_SITE_OTHER): Payer: 59

## 2021-12-30 ENCOUNTER — Ambulatory Visit: Payer: 59 | Admitting: Gastroenterology

## 2021-12-30 VITALS — BP 105/76 | HR 92 | Ht 62.0 in | Wt 196.0 lb

## 2021-12-30 DIAGNOSIS — E119 Type 2 diabetes mellitus without complications: Secondary | ICD-10-CM

## 2021-12-30 DIAGNOSIS — Z1211 Encounter for screening for malignant neoplasm of colon: Secondary | ICD-10-CM

## 2021-12-30 DIAGNOSIS — D509 Iron deficiency anemia, unspecified: Secondary | ICD-10-CM

## 2021-12-30 DIAGNOSIS — E538 Deficiency of other specified B group vitamins: Secondary | ICD-10-CM | POA: Diagnosis not present

## 2021-12-30 LAB — FOLATE: Folate: 14 ng/mL (ref >5.9)

## 2021-12-30 LAB — IBC + FERRITIN
Ferritin: 14.6 ng/mL (ref 10.0–291.0)
Iron: 59 ug/dL (ref 42–145)
Saturation Ratios: 13.1 % — ABNORMAL LOW (ref 20.0–50.0)
TIBC: 449.4 ug/dL (ref 250.0–450.0)
Transferrin: 321 mg/dL (ref 212.0–360.0)

## 2021-12-30 LAB — VITAMIN B12: Vitamin B-12: 1071 pg/mL — ABNORMAL HIGH (ref 211–911)

## 2021-12-30 MED ORDER — CLENPIQ 10-3.5-12 MG-GM -GM/175ML PO SOLN: 1.0000 | Freq: Once | ORAL | 0 refills | Status: AC

## 2021-12-30 NOTE — Progress Notes (Signed)
Chief Complaint: Iron deficiency anemia   Referring Provider:     Marco Collie, MD   HPI:     Ashley Kane is a 46 y.o. female with a history of diabetes (A1c 6.5%), hypertriglyceridemia, OSA, Endometriosis requiring surgical excision 2020 with left ovary removed, referred to the Gastroenterology Clinic for evaluation of iron deficiency anemia.  She denies any overt bleeding to include hematochezia or melena.  No abdominal pain, nausea/vomiting, change in bowel habits.  Reviewed outside records sent by [facility] which are notable for the following: 09/24/2018: - H/H 9.1/27.6, MCV/RDW 83/15.6  10/07/2021: - H/H 9.4/29, MCV/RDW 87/13 - FOBT negative  10/30/2021: - H/H 8.4/27.3, MCV/RDW 89/13.6  12/09/2021: - Normal CMP (BG 109), normal TSH  12/22/2021: - H/H 9.1/20.5, MCV/RDW 91/14  No iron panel was in lab work sent, but patient reports having been told she was iron and B12 deficient. Was treated with weekly B12, and now monthly. Did receive B12 injection on 11/28/2021. Did increase iron containing foods and also started taking OTC iron a few months ago, and takes periodically.   No previous EGD or colonoscopy.  Currently prescribed Ozempic weekly.  No known family history of CRC, GI malignancy, liver disease, pancreatic disease, or IBD.    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     Blood Glucose Monitoring Suppl (GLUCOCOM BLOOD GLUCOSE MONITOR) DEVI Use 3 times daily. DX CODE E11.42     Continuous Blood Gluc Sensor (FREESTYLE LIBRE 2 SENSOR) MISC      gabapentin (NEURONTIN) 400 MG capsule Take by mouth.     glucose blood (CONTOUR NEXT TEST) test strip 3 (three) times daily.     latanoprost (XALATAN) 0.005 % ophthalmic solution Place 1 drop into both eyes nightly.     losartan (COZAAR) 100 MG tablet Take 1 tablet by mouth daily.     NON FORMULARY Atcos 15 mg 1 a day     pioglitazone (ACTOS) 30 MG tablet Take 1 tablet by mouth daily.     Semaglutide, 2 MG/DOSE, (OZEMPIC, 2 MG/DOSE,) 8 MG/3ML SOPN Inject 2 mg under the skin once weekly. 9 mL 0   VASCEPA 1 g capsule Take 1 g by mouth 2 (two) times daily.     Accu-Chek Softclix Lancets lancets Use 3 times daily. DX CODE E11.42 (Patient not taking: Reported on 12/30/2021)     acetaminophen (TYLENOL) 325 MG tablet Take by mouth. (Patient not taking: Reported on 12/30/2021)     empagliflozin (JARDIANCE) 25 MG TABS tablet Take 1 tablet by mouth daily. (Patient not taking: Reported on 12/30/2021)     fenofibrate  160 MG tablet  (Patient not taking: Reported on 12/30/2021)     No current facility-administered medications for this visit.   Allergies  Allergen Reactions   Lisinopril     Other reaction(s): Cough (ALLERGY/intolerance)   Canagliflozin     Other reaction(s): Dizziness (intolerance)   Lactose Diarrhea    Confirmed avoids all dairy. MM, RD 09/23/18   Metformin Diarrhea     Review of Systems: All systems reviewed and negative except where noted in HPI.     Physical Exam:    Wt Readings from Last 3 Encounters:  12/30/21 196 lb (88.9 kg)    BP 105/76   Pulse 92   Ht 5\' 2"  (1.575 m)   Wt 196 lb (88.9 kg)   BMI 35.85 kg/m  Constitutional:  Pleasant, in no acute distress. Psychiatric: Normal mood and affect. Behavior  is normal. Cardiovascular: Normal rate, regular rhythm. No edema Pulmonary/chest: Effort normal and breath sounds normal. No wheezing, rales or rhonchi. Abdominal: Soft, nondistended, nontender. Bowel sounds active throughout. There are no masses palpable. No hepatomegaly. Neurological: Alert and oriented to person place and time. Skin: Skin is warm and dry. No rashes noted.   ASSESSMENT AND PLAN;   1) Iron deficiency anemia 2) B12 deficiency Discussed potential GI etiologies for both iron and B12 deficiency anemia.  Plan for further evaluation as below: - Check iron panel, B12, folate - EGD with gastric/duodenal biopsies - Colonoscopy - Continue B12 supplement - Continue oral iron.  Stop 7 days prior to bowel preparation - If EGD/colonoscopy unrevealing and continued iron deficiency, plan for VCE and referral for IV iron  3) Colon cancer screening - Will complete routine, age-appropriate CRC screening at time of colonoscopy as above  4) Diabetes - Hold Ozempic prior to procedure per endoscopy protocol   The indications, risks, and benefits of EGD and colonoscopy were explained to the patient in detail. Risks include but are not limited to bleeding, perforation, adverse reaction to medications, and cardiopulmonary compromise. Sequelae include but are not limited to the possibility of surgery, hospitalization, and mortality. The patient verbalized understanding and wished to proceed. All questions answered, referred to scheduler and bowel prep ordered. Further recommendations pending results of the exam.    Lavena Bullion, DO, FACG  12/30/2021, 9:06 AM   Maryella Shivers, MD

## 2021-12-30 NOTE — Patient Instructions (Addendum)
_______________________________________________________ Your provider has requested that you go to the basement level for lab work before leaving today. Press "B" on the elevator. The lab is located at the first door on the left as you exit the elevator.   We have sent the following medications to your pharmacy for you to pick up at your convenience: Clenpiq  You have been scheduled for an endoscopy and colonoscopy. Please follow the written instructions given to you at your visit today. Please pick up your prep supplies at the pharmacy within the next 1-3 days. If you use inhalers (even only as needed), please bring them with you on the day of your procedure.    If you are age 47 or younger, your body mass index should be between 19-25. Your Body mass index is 35.85 kg/m. If this is out of the aformentioned range listed, please consider follow up with your Primary Care Provider.   ________________________________________________________  The [place] [facility] providers would like to encourage you to use [facility] to communicate with providers for non-urgent requests or questions.  Due to long hold times on the telephone, sending your provider a message by [facility] may be a faster and more efficient way to get a response.  Please allow 48 business hours for a response.  Please remember that this is for non-urgent requests.  _______________________________________________________  Due to recent changes in healthcare laws, you may see the results of your imaging and laboratory studies on MyChart before your provider has had a chance to review them.  We understand that in some cases there may be results that are confusing or concerning to you. Not all laboratory results come back in the same time frame and the provider may be waiting for multiple results in order to interpret others.  Please give [place] 48 hours in order for your provider to thoroughly review all the results before contacting the office for  clarification of your results.     Thank you for choosing me and [facility].  Vito Cirigliano, D.O.

## 2022-01-27 ENCOUNTER — Other Ambulatory Visit (HOSPITAL_COMMUNITY): Payer: Self-pay

## 2022-02-02 ENCOUNTER — Encounter: Payer: 59 | Admitting: Gastroenterology

## 2022-02-06 ENCOUNTER — Encounter: Payer: Self-pay | Admitting: Gastroenterology

## 2022-02-16 ENCOUNTER — Encounter: Payer: 59 | Admitting: Gastroenterology

## 2022-03-21 ENCOUNTER — Other Ambulatory Visit (HOSPITAL_COMMUNITY): Payer: Self-pay

## 2022-03-23 ENCOUNTER — Other Ambulatory Visit (HOSPITAL_COMMUNITY): Payer: Self-pay

## 2022-04-07 ENCOUNTER — Other Ambulatory Visit (HOSPITAL_BASED_OUTPATIENT_CLINIC_OR_DEPARTMENT_OTHER): Payer: Self-pay

## 2022-04-07 MED ORDER — OZEMPIC (2 MG/DOSE) 8 MG/3ML ~~LOC~~ SOPN
2.0000 mg | PEN_INJECTOR | SUBCUTANEOUS | 1 refills | Status: DC
  Filled 2022-04-07: qty 6, 56d supply, fill #0
  Filled 2022-04-20: qty 3, 28d supply, fill #0
  Filled 2022-05-21 (×2): qty 3, 28d supply, fill #1
  Filled 2022-06-20: qty 3, 28d supply, fill #2
  Filled 2022-07-25: qty 3, 28d supply, fill #3
  Filled 2022-07-25 (×2): qty 6, 56d supply, fill #3
  Filled 2022-10-19: qty 3, 28d supply, fill #4

## 2022-04-20 ENCOUNTER — Other Ambulatory Visit (HOSPITAL_COMMUNITY): Payer: Self-pay

## 2022-04-27 ENCOUNTER — Other Ambulatory Visit (HOSPITAL_COMMUNITY): Payer: Self-pay

## 2022-05-21 ENCOUNTER — Other Ambulatory Visit (HOSPITAL_COMMUNITY): Payer: Self-pay

## 2022-07-25 ENCOUNTER — Other Ambulatory Visit (HOSPITAL_COMMUNITY): Payer: Self-pay

## 2022-07-29 ENCOUNTER — Other Ambulatory Visit (HOSPITAL_COMMUNITY): Payer: Self-pay

## 2022-09-18 IMAGING — MG MM DIGITAL SCREENING BILAT W/ TOMO AND CAD
8 series · 8 of 24 positions shown · non-contrast
Comparison: None.

CLINICAL DATA: Screening.

EXAM:
DIGITAL SCREENING BILATERAL MAMMOGRAM WITH TOMOSYNTHESIS AND CAD
TECHNIQUE: Bilateral screening digital craniocaudal and mediolateral oblique
mammograms were obtained. Bilateral screening digital breast
tomosynthesis was performed. The images were evaluated with
computer-aided detection.

[R CC synth-2D]
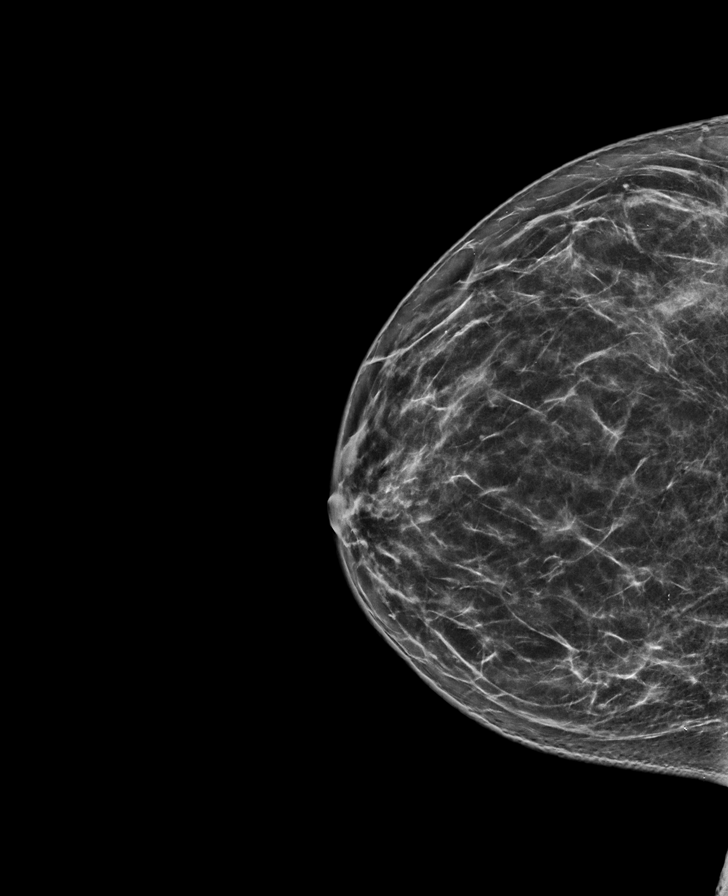

[R MLO synth-2D]
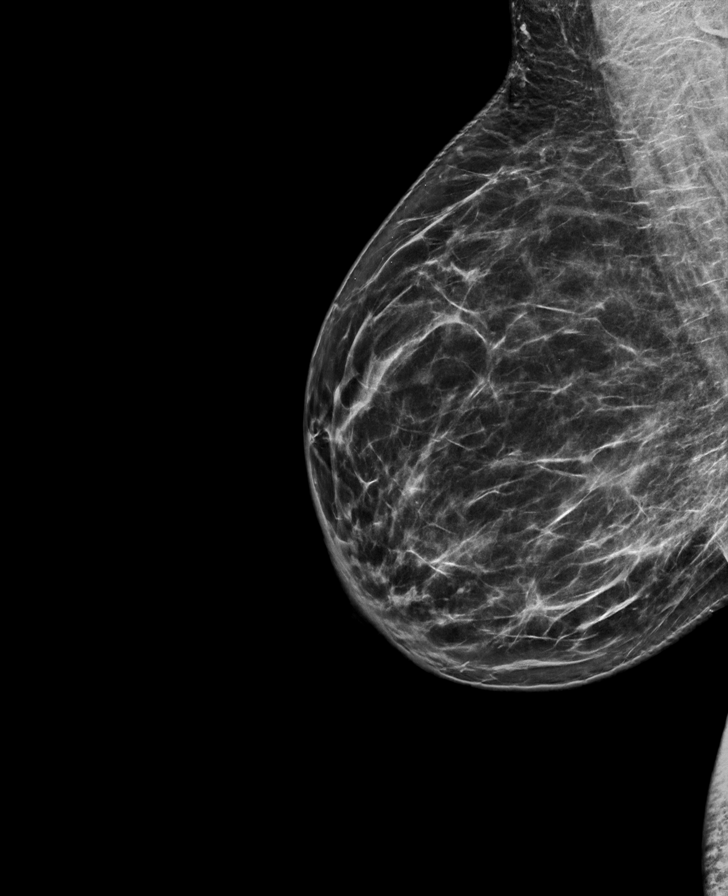

[L MLO synth-2D]
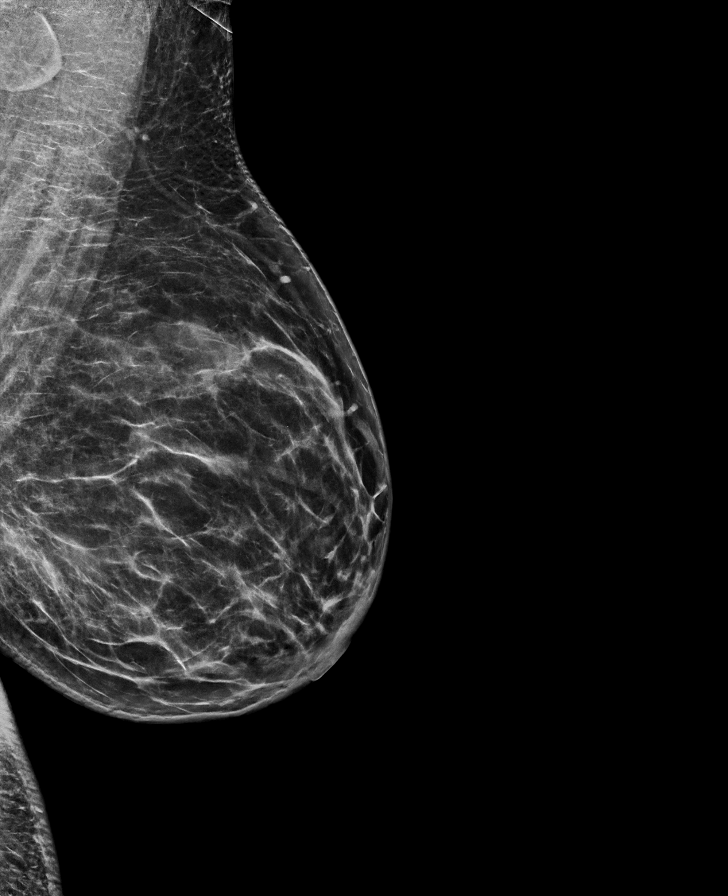

[L CC synth-2D]
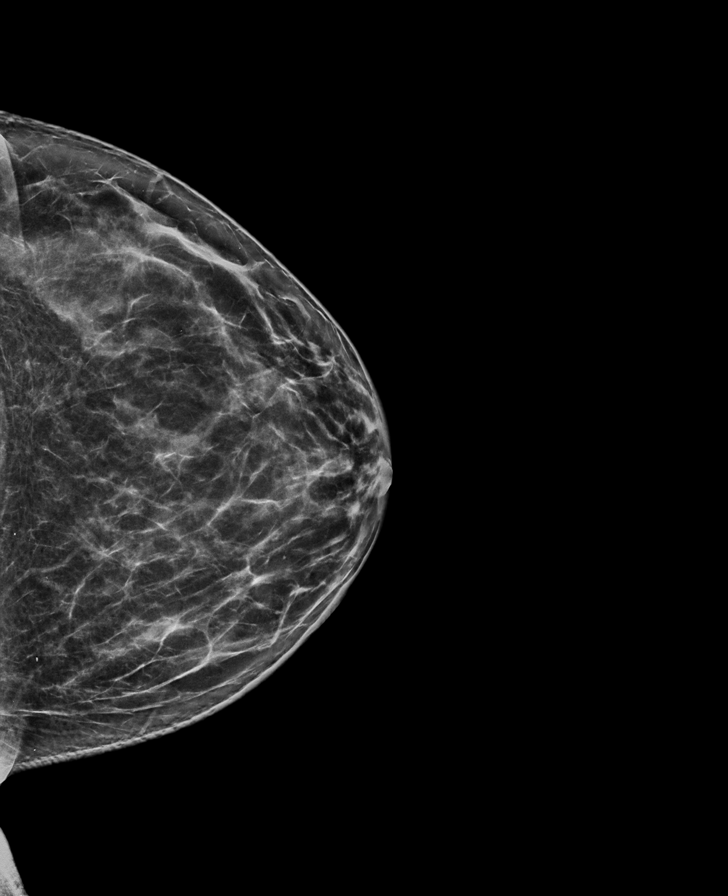

[R CC tomo · tomo slice 37/74.0]
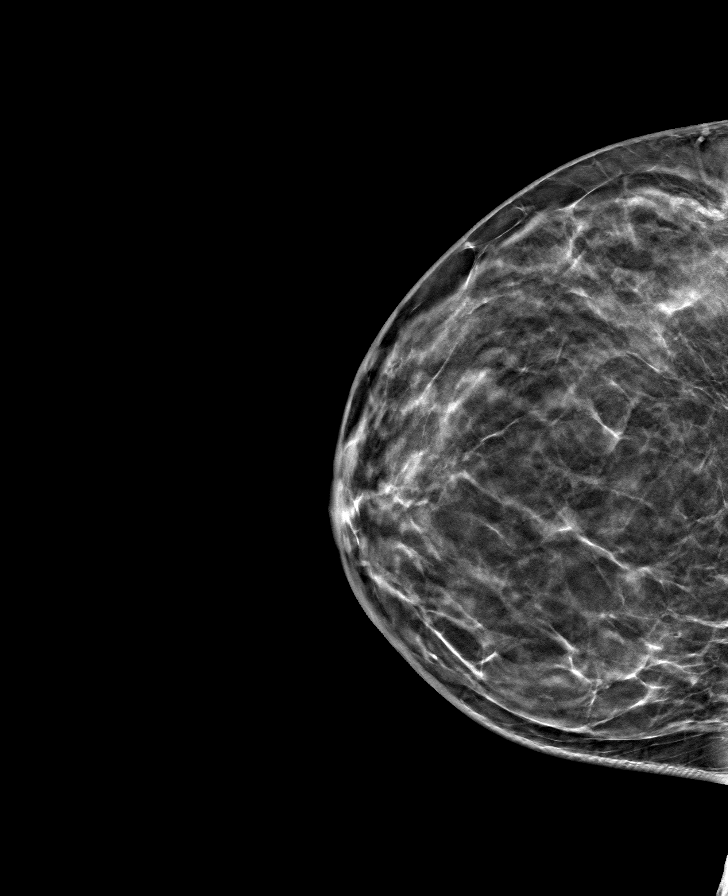

[L MLO tomo · tomo slice 39/77.0]
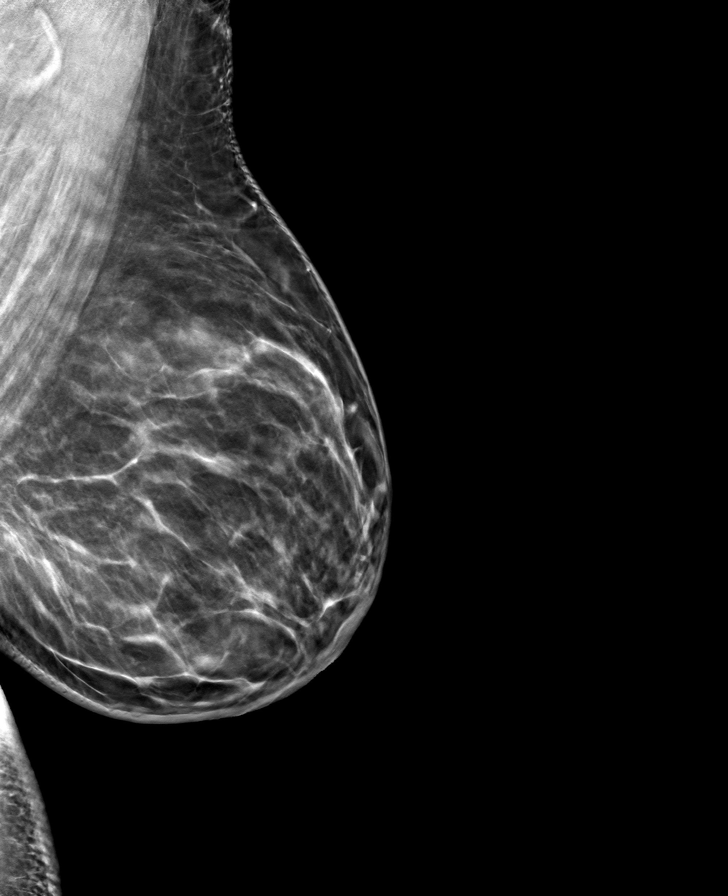

[R MLO tomo · tomo slice 41/81.0]
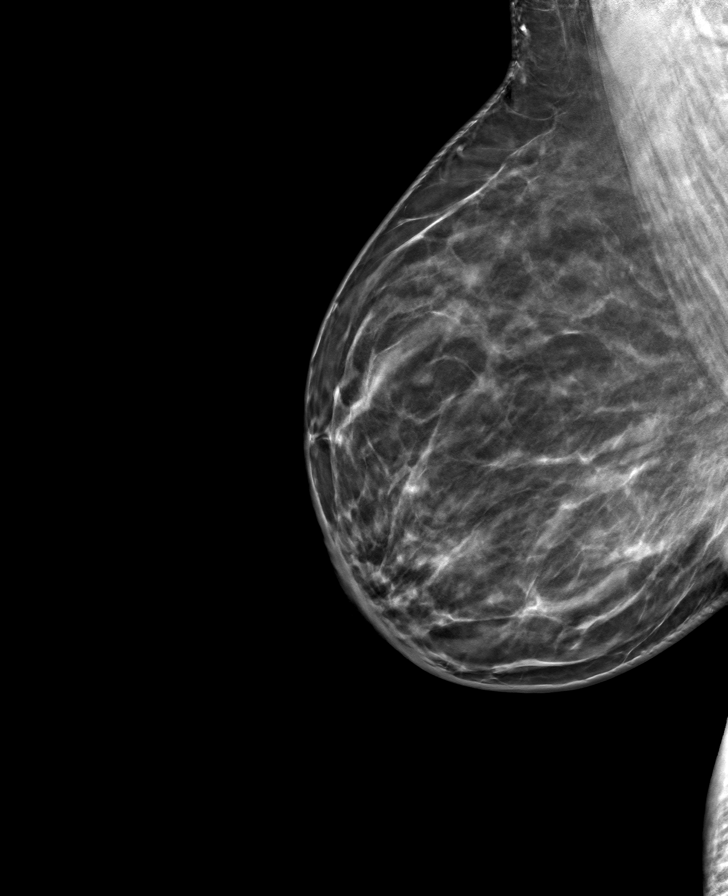

[L CC tomo · tomo slice 37/74.0]
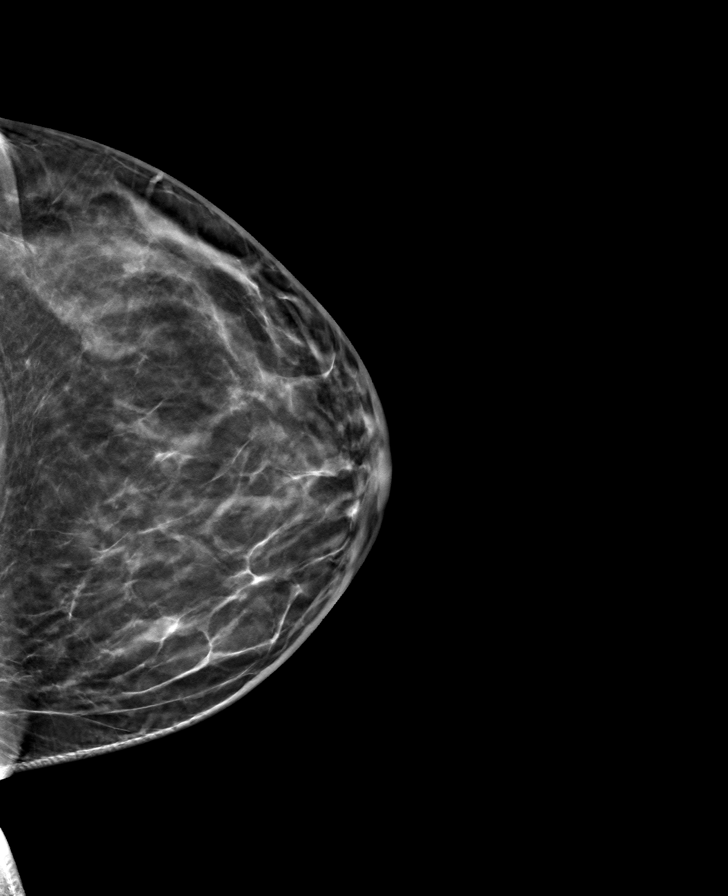

[8 of 24 positions shown; findings below may reference images not displayed]

ACR Breast Density Category b: There are scattered areas of
fibroglandular density.
FINDINGS: There are no findings suspicious for malignancy.
IMPRESSION: No mammographic evidence of malignancy. A result letter of this
screening mammogram will be mailed directly to the patient.

RECOMMENDATION:
Screening mammogram in one year. (Code:XG-X-X7B)

BI-RADS CATEGORY  1: Negative.

## 2022-10-14 ENCOUNTER — Telehealth: Payer: Self-pay | Admitting: Gastroenterology

## 2022-10-14 NOTE — Telephone Encounter (Signed)
PT has a low hemoglobin and wants to set an appointment. I advised first availibility was November with Dr. Barron Alvine. She refuses to see an NP or PA. She would like to discuss why she cannot get in early and wants a nurse to speak about her issues.

## 2022-10-15 NOTE — Telephone Encounter (Signed)
PT is scheduled for OV 8/12 3pm

## 2022-10-19 ENCOUNTER — Other Ambulatory Visit (HOSPITAL_COMMUNITY): Payer: Self-pay

## 2022-10-19 ENCOUNTER — Encounter: Payer: Self-pay | Admitting: Gastroenterology

## 2022-10-19 ENCOUNTER — Ambulatory Visit (INDEPENDENT_AMBULATORY_CARE_PROVIDER_SITE_OTHER): Payer: BC Managed Care – PPO | Admitting: Gastroenterology

## 2022-10-19 VITALS — BP 98/60 | HR 92 | Ht 62.0 in | Wt 191.2 lb

## 2022-10-19 DIAGNOSIS — E119 Type 2 diabetes mellitus without complications: Secondary | ICD-10-CM | POA: Diagnosis not present

## 2022-10-19 DIAGNOSIS — E538 Deficiency of other specified B group vitamins: Secondary | ICD-10-CM

## 2022-10-19 DIAGNOSIS — D509 Iron deficiency anemia, unspecified: Secondary | ICD-10-CM

## 2022-10-19 DIAGNOSIS — Z79899 Other long term (current) drug therapy: Secondary | ICD-10-CM

## 2022-10-19 DIAGNOSIS — Z1211 Encounter for screening for malignant neoplasm of colon: Secondary | ICD-10-CM

## 2022-10-19 DIAGNOSIS — Z7985 Long-term (current) use of injectable non-insulin antidiabetic drugs: Secondary | ICD-10-CM

## 2022-10-19 MED ORDER — NA SULFATE-K SULFATE-MG SULF 17.5-3.13-1.6 GM/177ML PO SOLN: 1.0000 | Freq: Once | ORAL | 0 refills | Status: AC

## 2022-10-19 NOTE — Progress Notes (Signed)
Chief Complaint:    Iron deficiency anemia  GI History: Ashley Kane is a 47 y.o. female with a history of diabetes (A1c 8.5%), hypertriglyceridemia, OSA, Endometriosis requiring surgical excision 2020 with left ovary removed, initially seen in the GI clinic on 12/27/2022 for evaluation of iron deficiency anemia.  Anemia dates back to at least 09/2018.  - 11/28/2021: Received B12 injection, OTC oral iron - 12/22/2021: H/H 9.1/20.5, MCV/RDW 91/14.   - 12/30/2021: Initial GI evaluation.  Recommended EGD/colonoscopy, continued B12 supplement, continued oral iron - 12/30/2021: Ferritin 14.6, iron 59, TIBC 450, sat 13%.  Normal folate.  B12 1071  HPI:     Patient is a 47 y.o. female presenting to the Gastroenterology Clinic for follow-up.  Was initially seen by me on 12/30/2021 for evaluation of IDA.  She was scheduled for EGD/colonoscopy at that time, but was a no-show.   She presents to the office today to pick up on her anemia evaluation.  She reports having labs in [place] about 6 weeks ago (08/22/2022): - H/H 8.9/27.7 with MCV 85 - Hgb A1c 8.5% - Normal LAEs, BUN/Creat, PLT, WBCs  Otherwise no GI sxs. No CP. Has increased 20# over 5 months or so. Takes PO iron periodically. No longer on B12.   No new abdominal imaging for review since last appointment.   Review of systems:     No chest pain, no SOB, no fevers, no urinary sx   Past Medical History:  Diagnosis Date   Diabetes mellitus, type 2 ([facility])    DM neuropathy, type II diabetes mellitus ([facility])    Hypertriglyceridemia    Morbid obesity ([facility])    Vitamin D deficiency     Patient's surgical history, family medical history, social history, medications and allergies were all reviewed in Epic    Current Outpatient Medications  Medication Sig Dispense Refill   Accu-Chek Softclix Lancets lancets      atorvastatin (LIPITOR) 10 MG tablet Take by mouth.     Blood Glucose Monitoring Suppl (GLUCOCOM BLOOD GLUCOSE MONITOR) DEVI Use 3  times daily. DX CODE E11.42     Continuous Blood Gluc Sensor (FREESTYLE LIBRE 2 SENSOR) MISC      dorzolamide-timolol (COSOPT) 2-0.5 % ophthalmic solution Place 1 drop into both eyes 2 (two) times daily.     empagliflozin (JARDIANCE) 25 MG TABS tablet Take 1 tablet by mouth daily.     fenofibrate micronized (LOFIBRA) 200 MG capsule Take 200 mg by mouth daily.     gabapentin (NEURONTIN) 400 MG capsule Take 400 mg by mouth 2 (two) times daily.     LANTUS SOLOSTAR 100 UNIT/ML Solostar Pen Inject into the skin. 15 units at breakfast and 50 units at bedtime     latanoprost (XALATAN) 0.005 % ophthalmic solution Place 1 drop into both eyes nightly.     losartan (COZAAR) 100 MG tablet Take 1 tablet by mouth daily.     NON FORMULARY Atcos 15 mg 1 a day     Semaglutide, 2 MG/DOSE, (OZEMPIC, 2 MG/DOSE,) 8 MG/3ML SOPN Inject 2 mg into the skin once a week. 9 mL 1   VASCEPA 1 g capsule Take 1 g by mouth 2 (two) times daily.     Vitamin D, Ergocalciferol, (DRISDOL) 1.25 MG (50000 UNIT) CAPS capsule Take 50,000 Units by mouth once a week.     No current facility-administered medications for this visit.    Physical Exam:     BP 98/60 (BP Location: Left Arm, Patient Position: Sitting,  Cuff Size: Large)   Pulse 92   Ht 5\' 2"  (1.575 m)   Wt 191 lb 4 oz (86.8 kg)   BMI 34.98 kg/m   GENERAL:  Pleasant female in NAD PSYCH: : Cooperative, normal affect Musculoskeletal:  Normal muscle tone, normal strength NEURO: Alert and oriented x 3, no focal neurologic deficits   IMPRESSION and PLAN:    1) Iron Deficiency anemia 2) B12 deficiency  - Check iron panel, B12, folate today - EGD/Colonoscopy for diagnostic and potentially therapeutic intent  3) Colon cancer screening - Will complete routine, age-appropriate CRC screening at time of colonoscopy as above   4) Diabetes 5) Medication management - Hold Ozempic prior to procedure per endoscopy protocol  The indications, risks, and benefits of EGD and  colonoscopy were explained to the patient in detail. Risks include but are not limited to bleeding, perforation, adverse reaction to medications, and cardiopulmonary compromise. Sequelae include but are not limited to the possibility of surgery, hospitalization, and mortality. The patient verbalized understanding and wished to proceed. All questions answered, referred to scheduler and bowel prep ordered. Further recommendations pending results of the exam.            Shellia Cleverly ,DO, FACG 10/19/2022, 3:19 PM

## 2022-10-19 NOTE — Patient Instructions (Signed)
Your provider has requested that you go to the basement level for lab work before leaving today. Press "B" on the elevator. The lab is located at the first door on the left as you exit the elevator.   You have been scheduled for an endoscopy and colonoscopy. Please follow the written instructions given to you at your visit today.  Please pick up your prep supplies at the pharmacy within the next 1-3 days.  If you use inhalers (even only as needed), please bring them with you on the day of your procedure.  DO NOT TAKE 7 DAYS PRIOR TO TEST- Trulicity (dulaglutide) Ozempic, Wegovy (semaglutide) Mounjaro (tirzepatide) Bydureon Bcise (exanatide extended release)  DO NOT TAKE 1 DAY PRIOR TO YOUR TEST Rybelsus (semaglutide) Adlyxin (lixisenatide) Victoza (liraglutide) Byetta (exanatide) ___________________________________________________________________________   _______________________________________________________  If your blood pressure at your visit was 140/90 or greater, please contact your primary care physician to follow up on this.  _______________________________________________________  If you are age 77 or older, your body mass index should be between 23-30. Your Body mass index is 34.98 kg/m. If this is out of the aforementioned range listed, please consider follow up with your Primary Care Provider.  If you are age 24 or younger, your body mass index should be between 19-25. Your Body mass index is 34.98 kg/m. If this is out of the aformentioned range listed, please consider follow up with your Primary Care Provider.   __________________________________________________________  The [facility] providers would like to encourage you to use [facility] to communicate with providers for non-urgent requests or questions.  Due to long hold times on the telephone, sending your provider a message by [facility] may be a faster and more efficient way to get a response.  Please allow 48  business hours for a response.  Please remember that this is for non-urgent requests.      Thank you for choosing me and  Gastroenterology.  Vito Cirigliano, D.O.

## 2022-11-16 ENCOUNTER — Encounter: Payer: Self-pay | Admitting: Gastroenterology

## 2022-11-20 ENCOUNTER — Other Ambulatory Visit (HOSPITAL_COMMUNITY): Payer: Self-pay

## 2022-11-20 MED ORDER — SEMAGLUTIDE (2 MG/DOSE) 8 MG/3ML ~~LOC~~ SOPN
2.0000 mg | PEN_INJECTOR | SUBCUTANEOUS | 1 refills | Status: DC
  Filled 2022-11-20: qty 9, 84d supply, fill #0

## 2022-11-23 ENCOUNTER — Telehealth: Payer: Self-pay | Admitting: Gastroenterology

## 2022-11-23 NOTE — Telephone Encounter (Signed)
Patient states that her blood sugars have been running high since having a steroid injection for "frozen shoulder." Patient is scheduled for 11/30/22 ECL. States her glucose has been running in the 300s-400s. I have explained that we would likely need to reschedule her procedure further out so she can get her sugars under control. She says she is seeing her endocrinologist tomorrow and will call us back to let us know if he thinks he can get her levels to an acceptable level prior to procedure.

## 2022-11-23 NOTE — Telephone Encounter (Signed)
Patient is scheduled for an endo/colon on 9/23 and after getting her steroid shot, it made her sugar high. She wants to know will she still be able to proceed with procedure. Please advise.

## 2022-11-26 NOTE — Telephone Encounter (Signed)
ENCOUNTER OPENED IN ERROR

## 2022-11-30 ENCOUNTER — Ambulatory Visit: Payer: BC Managed Care – PPO | Admitting: Gastroenterology

## 2022-11-30 ENCOUNTER — Encounter: Payer: Self-pay | Admitting: Gastroenterology

## 2022-11-30 VITALS — BP 103/67 | HR 83 | Temp 98.3°F | Resp 12 | Ht 62.0 in | Wt 191.0 lb

## 2022-11-30 DIAGNOSIS — D509 Iron deficiency anemia, unspecified: Secondary | ICD-10-CM

## 2022-11-30 DIAGNOSIS — Z1211 Encounter for screening for malignant neoplasm of colon: Secondary | ICD-10-CM

## 2022-11-30 DIAGNOSIS — E538 Deficiency of other specified B group vitamins: Secondary | ICD-10-CM | POA: Diagnosis not present

## 2022-11-30 DIAGNOSIS — D123 Benign neoplasm of transverse colon: Secondary | ICD-10-CM

## 2022-11-30 MED ORDER — SODIUM CHLORIDE 0.9 % IV SOLN: 500.0000 mL | INTRAVENOUS | Status: DC

## 2022-11-30 NOTE — Op Note (Signed)
[facility] Patient Name: Ashley Kane Procedure Date: 11/30/2022 2:47 PM MRN: [id number] Endoscopist: Doristine Locks , MD, [id number] Age: 47 Referring MD:  Date of Birth: Nov 28, 1975 Gender: Female Account #: [id number] Procedure:                Upper GI endoscopy Indications:              Iron deficiency anemia, B12 deficiency Medicines:                Monitored Anesthesia Care Procedure:                Pre-Anesthesia Assessment:                           - Prior to the procedure, a History and Physical                            was performed, and patient medications and                            allergies were reviewed. The patient's tolerance of                            previous anesthesia was also reviewed. The risks                            and benefits of the procedure and the sedation                            options and risks were discussed with the patient.                            All questions were answered, and informed consent                            was obtained. Prior Anticoagulants: The patient has                            taken no anticoagulant or antiplatelet agents. ASA                            Grade Assessment: III - A patient with severe                            systemic disease. After reviewing the risks and                            benefits, the patient was deemed in satisfactory                            condition to undergo the procedure.                           After obtaining informed consent, the endoscope was  passed under direct vision. Throughout the                            procedure, the patient's blood pressure, pulse, and                            oxygen saturations were monitored continuously. The                            Olympus Scope [id number] was introduced through the                            mouth, and advanced to the third part of duodenum.                            The upper  GI endoscopy was accomplished without                            difficulty. The patient tolerated the procedure                            well. Scope In: Scope Out: Findings:                 The examined esophagus was normal.                           The entire examined stomach was normal. Biopsies                            were taken with a cold forceps for histology.                            Estimated blood loss was minimal.                           The examined duodenum was normal. Biopsies were                            taken with a cold forceps for histology. Estimated                            blood loss was minimal. Complications:            No immediate complications. Estimated Blood Loss:     Estimated blood loss was minimal. Impression:               - Normal esophagus.                           - Normal stomach. Biopsied.                           - Normal examined duodenum. Biopsied. Recommendation:           - Patient has a contact number available for  emergencies. The signs and symptoms of potential                            delayed complications were discussed with the                            patient. Return to normal activities tomorrow.                            Written discharge instructions were provided to the                            patient.                           - Resume previous diet.                           - Continue present medications.                           - Await pathology results.                           - Perform a colonoscopy today.                           - Repeat iron panel, B12, folate as previously                            ordered.                           - If pathology results unrevealing, and ongoing                            iron deficiency anemia, will consider further small                            bowel interrogation with Video Capsule Endoscopy                             (VCE). Doristine Locks, MD 11/30/2022 3:18:30 PM

## 2022-11-30 NOTE — Progress Notes (Signed)
Called to room to assist during endoscopic procedure.  Patient ID and intended procedure confirmed with present staff. Received instructions for my participation in the procedure from the performing physician.  

## 2022-11-30 NOTE — Patient Instructions (Signed)
YOU HAD AN ENDOSCOPIC PROCEDURE TODAY AT THE [facility]:   Refer to the procedure report that was given to you for any specific questions about what was found during the examination.  If the procedure report does not answer your questions, please call your gastroenterologist to clarify.  If you requested that your care partner not be given the details of your procedure findings, then the procedure report has been included in a sealed envelope for you to review at your convenience later.  YOU SHOULD EXPECT: Some feelings of bloating in the abdomen. Passage of more gas than usual.  Walking can help get rid of the air that was put into your GI tract during the procedure and reduce the bloating. If you had a lower endoscopy (such as a colonoscopy or flexible sigmoidoscopy) you may notice spotting of blood in your stool or on the toilet paper. If you underwent a bowel prep for your procedure, you may not have a normal bowel movement for a few days.  Please Note:  You might notice some irritation and congestion in your nose or some drainage.  This is from the oxygen used during your procedure.  There is no need for concern and it should clear up in a day or so.  SYMPTOMS TO REPORT IMMEDIATELY:  Following lower endoscopy (colonoscopy or flexible sigmoidoscopy):  Excessive amounts of blood in the stool  Significant tenderness or worsening of abdominal pains  Swelling of the abdomen that is new, acute  Fever of 100F or higher  Following upper endoscopy (EGD)  Vomiting of blood or coffee ground material  New chest pain or pain under the shoulder blades  Painful or persistently difficult swallowing  New shortness of breath  Fever of 100F or higher  Black, tarry-looking stools  For urgent or emergent issues, a gastroenterologist can be reached at any hour by calling [phone]. Do not use MyChart messaging for urgent concerns.    DIET:  We do recommend a small meal at first, but  then you may proceed to your regular diet.  Drink plenty of fluids but you should avoid alcoholic beverages for 24 hours.  ACTIVITY:  You should plan to take it easy for the rest of today and you should NOT DRIVE or use heavy machinery until tomorrow (because of the sedation medicines used during the test).    FOLLOW UP: Our staff will call the number listed on your records the next business day following your procedure.  We will call around 7:15- 8:00 am to check on you and address any questions or concerns that you may have regarding the information given to you following your procedure. If we do not reach you, we will leave a message.     If any biopsies were taken you will be contacted by phone or by letter within the next 1-3 weeks.  Please call us at [phone] if you have not heard about the biopsies in 3 weeks.    SIGNATURES/CONFIDENTIALITY: You and/or your care partner have signed paperwork which will be entered into your electronic medical record.  These signatures attest to the fact that that the information above on your After Visit Summary has been reviewed and is understood.  Full responsibility of the confidentiality of this discharge information lies with you and/or your care-partner.

## 2022-11-30 NOTE — Op Note (Signed)
[facility] Patient Name: Ashley Kane Procedure Date: 11/30/2022 2:43 PM MRN: [id number] Endoscopist: Doristine Locks , MD, [id number] Age: 47 Referring MD:  Date of Birth: Mar 27, 1975 Gender: Female Account #: [id number] Procedure:                Colonoscopy Indications:              Iron deficiency anemia, B12 deficiency                           Additionally, due for initial colon cancer                            screening. Medicines:                Monitored Anesthesia Care Procedure:                Pre-Anesthesia Assessment:                           - Prior to the procedure, a History and Physical                            was performed, and patient medications and                            allergies were reviewed. The patient's tolerance of                            previous anesthesia was also reviewed. The risks                            and benefits of the procedure and the sedation                            options and risks were discussed with the patient.                            All questions were answered, and informed consent                            was obtained. Prior Anticoagulants: The patient has                            taken no anticoagulant or antiplatelet agents. ASA                            Grade Assessment: III - A patient with severe                            systemic disease. After reviewing the risks and                            benefits, the patient was deemed in satisfactory  condition to undergo the procedure.                           After obtaining informed consent, the colonoscope                            was passed under direct vision. Throughout the                            procedure, the patient's blood pressure, pulse, and                            oxygen saturations were monitored continuously. The                            CF [id number] #[id number] was introduced through the anus                             and advanced to the the terminal ileum. The                            colonoscopy was performed without difficulty. The                            patient tolerated the procedure well. The quality                            of the bowel preparation was good. The terminal                            ileum, ileocecal valve, appendiceal orifice, and                            rectum were photographed. Scope In: 3:01:46 PM Scope Out: 3:12:27 PM Scope Withdrawal Time: 0 hours 7 minutes 42 seconds  Total Procedure Duration: 0 hours 10 minutes 41 seconds  Findings:                 The perianal and digital rectal examinations were                            normal.                           A 4 mm polyp was found in the transverse colon. The                            polyp was sessile. The polyp was removed with a                            cold snare. Resection and retrieval were complete.                            Estimated blood loss was minimal.  The exam was otherwise normal throughout the                            examined colon.                           The retroflexed view of the distal rectum and anal                            verge was normal and showed no anal or rectal                            abnormalities.                           The terminal ileum appeared normal.                           The ascending colon revealed moderately excessive                            looping. Advancing the scope required using manual                            pressure along with a series of straightening and                            shortening the scope to obtain bowel loop reduction. Complications:            No immediate complications. Estimated Blood Loss:     Estimated blood loss was minimal. Impression:               - One 4 mm polyp in the transverse colon, removed                            with a cold snare. Resected and retrieved.                            - The distal rectum and anal verge are normal on                            retroflexion view.                           - The examined portion of the ileum was normal.                           - There was significant looping of the colon.                           - The remainder of the colon was normal appearing.                            No areas of bleeding, erosion, ulceration, or  inflammation noted on this study. Recommendation:           - Patient has a contact number available for                            emergencies. The signs and symptoms of potential                            delayed complications were discussed with the                            patient. Return to normal activities tomorrow.                            Written discharge instructions were provided to the                            patient.                           - Resume previous diet.                           - Continue present medications.                           - Await pathology results.                           - Repeat colonoscopy for surveillance based on                            pathology results. Doristine Locks, MD 11/30/2022 3:22:12 PM

## 2022-11-30 NOTE — Progress Notes (Signed)
GASTROENTEROLOGY PROCEDURE H&P NOTE   Primary Care Physician: Charlott Rakes, MD    Reason for Procedure:   Iron deficiency anemia, B12 deficiency, CRC screening  Plan:    EGD, colonoscopy  Patient is appropriate for endoscopic procedure(s) in the ambulatory ([facility]) setting.  The nature of the procedure, as well as the risks, benefits, and alternatives were carefully and thoroughly reviewed with the patient. Ample time for discussion and questions allowed. The patient understood, was satisfied, and agreed to proceed.     HPI: Ashley Kane is a 47 y.o. female who presents for EGD and colonoscopy for evaluation of patient's anemia B12 deficiency, along with routine CRC screening no previous EGD or colonoscopy.    She has been holding her Ozempic >1 week for procedure today.  Past Medical History:  Diagnosis Date   Diabetes mellitus, type 2 (HCC)    DM neuropathy, type II diabetes mellitus (HCC)    Hypertriglyceridemia    Morbid obesity (HCC)    Vitamin D deficiency     Past Surgical History:  Procedure Laterality Date   CESAREAN SECTION      Prior to Admission medications   Medication Sig Start Date End Date Taking? Authorizing Provider  Accu-Chek Softclix Lancets lancets  12/15/19   [provider]  atorvastatin (LIPITOR) 10 MG tablet Take by mouth. 11/22/19   [provider]  Blood Glucose Monitoring Suppl (GLUCOCOM BLOOD GLUCOSE MONITOR) DEVI Use 3 times daily. DX CODE E11.42 12/15/19   [provider]  Continuous Blood Gluc Sensor (FREESTYLE LIBRE 2 SENSOR) MISC  02/04/20   [provider]  dorzolamide-timolol (COSOPT) 2-0.5 % ophthalmic solution Place 1 drop into both eyes 2 (two) times daily.    [provider]  empagliflozin (JARDIANCE) 25 MG TABS tablet Take 1 tablet by mouth daily. 11/26/21   [provider]  fenofibrate micronized (LOFIBRA) 200 MG capsule Take 200 mg by mouth daily. 02/03/22   [provider]  gabapentin (NEURONTIN) 400 MG capsule Take 400 mg by mouth 2 (two) times daily. 08/22/21   [provider]  LANTUS SOLOSTAR 100 UNIT/ML Solostar Pen Inject into the skin. 15 units at breakfast and 50 units at bedtime    [provider]  latanoprost (XALATAN) 0.005 % ophthalmic solution Place 1 drop into both eyes nightly. 12/05/21   [provider]  losartan (COZAAR) 100 MG tablet Take 1 tablet by mouth daily. 11/26/21   [provider]  NON FORMULARY Atcos 15 mg 1 a day    [provider]  Semaglutide, 2 MG/DOSE, (OZEMPIC, 2 MG/DOSE,) 8 MG/3ML SOPN Inject 2 mg into the skin once a week. 11/20/22     VASCEPA 1 g capsule Take 1 g by mouth 2 (two) times daily. 01/08/20   [provider]  Vitamin D, Ergocalciferol, (DRISDOL) 1.25 MG (50000 UNIT) CAPS capsule Take 50,000 Units by mouth once a week.    [provider]    Current Outpatient Medications  Medication Sig Dispense Refill   Accu-Chek Softclix Lancets lancets      atorvastatin (LIPITOR) 10 MG tablet Take by mouth.     Blood Glucose Monitoring Suppl (GLUCOCOM BLOOD GLUCOSE MONITOR) DEVI Use 3 times daily. DX CODE E11.42     Continuous Blood Gluc Sensor (FREESTYLE LIBRE 2 SENSOR) MISC      dorzolamide-timolol (COSOPT) 2-0.5 % ophthalmic solution Place 1 drop into both eyes 2 (two) times daily.     empagliflozin (JARDIANCE) 25 MG TABS tablet  Take 1 tablet by mouth daily.     fenofibrate micronized (LOFIBRA) 200 MG capsule Take 200 mg by mouth daily.     gabapentin (NEURONTIN) 400 MG capsule Take 400 mg by mouth 2 (two) times daily.     LANTUS SOLOSTAR 100 UNIT/ML Solostar Pen Inject into the skin. 15 units at breakfast and 50 units at bedtime     latanoprost (XALATAN) 0.005 % ophthalmic solution Place 1 drop into both eyes nightly.     losartan (COZAAR) 100 MG tablet Take 1 tablet by mouth daily.     NON FORMULARY Atcos 15 mg 1 a day     Semaglutide, 2 MG/DOSE,  (OZEMPIC, 2 MG/DOSE,) 8 MG/3ML SOPN Inject 2 mg into the skin once a week. 9 mL 1   VASCEPA 1 g capsule Take 1 g by mouth 2 (two) times daily.     Vitamin D, Ergocalciferol, (DRISDOL) 1.25 MG (50000 UNIT) CAPS capsule Take 50,000 Units by mouth once a week.     Current Facility-Administered Medications  Medication Dose Route Frequency Provider Last Rate Last Admin   0.9 %  sodium chloride infusion  500 mL Intravenous Continuous Vaishnavi Dalby V, DO        Allergies as of 11/30/2022 - Review Complete 11/30/2022  Allergen Reaction Noted   Lisinopril  09/22/2018   Canagliflozin  04/23/2015   Lactose Diarrhea 08/15/2018   Metformin Diarrhea 01/13/2017   Silver Rash 01/13/2021    Family History  Problem Relation Age of Onset   Diabetes Mother    Stomach cancer Neg Hx    Esophageal cancer Neg Hx    Colon cancer Neg Hx     Social History   Socioeconomic History   Marital status: Married    Spouse name: Not on file   Number of children: 2   Years of education: Not on file   Highest education level: Not on file  Occupational History   Occupation: Runner, broadcasting/film/video  Tobacco Use   Smoking status: Never   Smokeless tobacco: Never  Vaping Use   Vaping status: Never Used  Substance and Sexual Activity   Alcohol use: Never   Drug use: Never   Sexual activity: Not on file  Other Topics Concern   Not on file  Social History Narrative   Not on file   Social Determinants of Health   Financial Resource Strain: Not on file  Food Insecurity: Not on file  Transportation Needs: Not on file  Physical Activity: Not on file  Stress: Not on file  Social Connections: Not on file  Intimate Partner Violence: Not on file    Physical Exam: Vital signs in last 24 hours: @BP  (!) 98/57   Pulse 87   Temp 98.3 F (36.8 C)   Ht 5\' 2"  (1.575 m)   Wt 191 lb (86.6 kg)   SpO2 100%   BMI 34.93 kg/m  GEN: NAD EYE: Sclerae anicteric ENT: MMM CV: Non-tachycardic Pulm: CTA b/l GI: Soft,  NT/ND NEURO:  Alert & Oriented x 3   Doristine Locks, DO [facility]   11/30/2022 2:11 PM

## 2022-11-30 NOTE — Progress Notes (Signed)
To pacu, VSS. Report to [email or web address] 

## 2022-12-01 ENCOUNTER — Other Ambulatory Visit: Payer: Self-pay | Admitting: *Deleted

## 2022-12-01 ENCOUNTER — Telehealth: Payer: Self-pay

## 2022-12-01 ENCOUNTER — Other Ambulatory Visit (HOSPITAL_COMMUNITY): Payer: Self-pay

## 2022-12-01 DIAGNOSIS — D509 Iron deficiency anemia, unspecified: Secondary | ICD-10-CM

## 2022-12-01 DIAGNOSIS — E538 Deficiency of other specified B group vitamins: Secondary | ICD-10-CM

## 2022-12-01 NOTE — Progress Notes (Signed)
Patient informed blood test needed. Patient asked to have labs completed at [facility]. Orders placed.

## 2022-12-01 NOTE — Telephone Encounter (Signed)
  Follow up Call-     11/30/2022    2:09 PM  Call back number  Post procedure Call Back phone  # [phone]  Permission to leave phone message Yes     Patient questions:  Do you have a fever, pain , or abdominal swelling? No. Pain Score  0 *  Have you tolerated food without any problems? Yes.    Have you been able to return to your normal activities? Yes.    Do you have any questions about your discharge instructions: Diet   No. Medications  No. Follow up visit  No.  Do you have questions or concerns about your Care? No.  Actions: * If pain score is 4 or above: No action needed, pain <4.

## 2022-12-02 ENCOUNTER — Other Ambulatory Visit (HOSPITAL_COMMUNITY): Payer: Self-pay

## 2022-12-02 MED ORDER — MOUNJARO 2.5 MG/0.5ML ~~LOC~~ SOAJ
2.5000 mg | SUBCUTANEOUS | 0 refills | Status: DC
Start: 2022-12-02 — End: 2023-02-19
  Filled 2022-12-04 (×3): qty 2, 28d supply, fill #0

## 2022-12-02 MED ORDER — MOUNJARO 7.5 MG/0.5ML ~~LOC~~ SOAJ: 7.5000 mg | SUBCUTANEOUS | 1 refills | Status: DC

## 2022-12-02 MED ORDER — MOUNJARO 7.5 MG/0.5ML ~~LOC~~ SOAJ
7.5000 mg | SUBCUTANEOUS | 1 refills | Status: AC
Start: 2022-12-01 — End: ?
  Filled 2022-12-02 – 2023-01-11 (×2): qty 2, 28d supply, fill #0
  Filled 2023-02-08 (×2): qty 2, 28d supply, fill #1

## 2022-12-03 ENCOUNTER — Other Ambulatory Visit (HOSPITAL_COMMUNITY): Payer: Self-pay

## 2022-12-03 LAB — SURGICAL PATHOLOGY

## 2022-12-04 ENCOUNTER — Other Ambulatory Visit (HOSPITAL_COMMUNITY): Payer: Self-pay

## 2022-12-05 ENCOUNTER — Other Ambulatory Visit (HOSPITAL_COMMUNITY): Payer: Self-pay

## 2022-12-07 ENCOUNTER — Other Ambulatory Visit (HOSPITAL_COMMUNITY): Payer: Self-pay

## 2022-12-08 ENCOUNTER — Other Ambulatory Visit: Payer: Self-pay | Admitting: *Deleted

## 2022-12-08 DIAGNOSIS — D509 Iron deficiency anemia, unspecified: Secondary | ICD-10-CM

## 2022-12-08 LAB — B12 AND FOLATE PANEL
Folate: 13.4 ng/mL (ref >3.0)
Vitamin B-12: 769 pg/mL (ref 232–1245)

## 2022-12-08 LAB — IRON,TIBC AND FERRITIN PANEL
Ferritin: 39 ng/mL (ref 15–150)
Iron Saturation: 10 % — ABNORMAL LOW (ref 15–55)
Iron: 32 ug/dL (ref 27–159)
Total Iron Binding Capacity: 335 ug/dL (ref 250–450)
UIBC: 303 ug/dL (ref 131–425)

## 2022-12-14 ENCOUNTER — Other Ambulatory Visit (HOSPITAL_COMMUNITY): Payer: Self-pay

## 2022-12-21 ENCOUNTER — Other Ambulatory Visit (HOSPITAL_COMMUNITY): Payer: Self-pay

## 2023-01-06 ENCOUNTER — Other Ambulatory Visit (HOSPITAL_COMMUNITY): Payer: Self-pay

## 2023-01-11 ENCOUNTER — Other Ambulatory Visit (HOSPITAL_COMMUNITY): Payer: Self-pay

## 2023-01-14 ENCOUNTER — Other Ambulatory Visit (HOSPITAL_COMMUNITY): Payer: Self-pay

## 2023-02-08 ENCOUNTER — Other Ambulatory Visit (HOSPITAL_COMMUNITY): Payer: Self-pay

## 2023-02-10 ENCOUNTER — Ambulatory Visit (INDEPENDENT_AMBULATORY_CARE_PROVIDER_SITE_OTHER): Payer: BC Managed Care – PPO

## 2023-02-10 ENCOUNTER — Ambulatory Visit: Payer: BC Managed Care – PPO | Admitting: Podiatry

## 2023-02-10 ENCOUNTER — Encounter: Payer: Self-pay | Admitting: Podiatry

## 2023-02-10 DIAGNOSIS — M14672 Charcot's joint, left ankle and foot: Secondary | ICD-10-CM

## 2023-02-10 DIAGNOSIS — M778 Other enthesopathies, not elsewhere classified: Secondary | ICD-10-CM | POA: Diagnosis not present

## 2023-02-14 NOTE — Progress Notes (Signed)
Subjective:   Patient ID: Ashley Kane, female   DOB: 47 y.o.   MRN: [id number]   HPI Patient presents with chronic foot pain left with swelling 1 year duration and shooting pains in both feet which only started in the last few days.  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 along with probability for acute neuropathic condition but no underlying pathology     Plan:  H&P reviewed and at this point continue gabapentin and hopefully the pain will resolve but for the left there is definite collapse of the mid arch tarsal joints consistent with Charcot foot and I went ahead today applied air fracture walker for complete immobilization and advised on compression therapy and reappoint in the next 3 to 4 weeks and hopefully we can get this under better control and if the shooting pain should get worse may need to see neurologist and I did write that down for her  X-rays indicate severe collapse of the Lisfranc joint left with multiple signs of Charcot deformity left negative on the right with good arch no indications of pathology

## 2023-02-16 ENCOUNTER — Other Ambulatory Visit: Payer: Self-pay | Admitting: Family Medicine

## 2023-02-16 DIAGNOSIS — Z1231 Encounter for screening mammogram for malignant neoplasm of breast: Secondary | ICD-10-CM

## 2023-02-24 ENCOUNTER — Ambulatory Visit
Admission: RE | Admit: 2023-02-24 | Discharge: 2023-02-24 | Disposition: A | Discharge status: Home / Self Care | Payer: BC Managed Care – PPO | Source: Ambulatory Visit | Attending: Family Medicine | Admitting: Family Medicine

## 2023-02-24 DIAGNOSIS — Z1231 Encounter for screening mammogram for malignant neoplasm of breast: Secondary | ICD-10-CM

## 2023-03-02 ENCOUNTER — Other Ambulatory Visit (HOSPITAL_COMMUNITY): Payer: Self-pay

## 2023-03-03 ENCOUNTER — Other Ambulatory Visit (HOSPITAL_COMMUNITY): Payer: Self-pay

## 2023-03-04 ENCOUNTER — Other Ambulatory Visit (HOSPITAL_COMMUNITY): Payer: Self-pay

## 2023-03-04 MED ORDER — MOUNJARO 10 MG/0.5ML ~~LOC~~ SOAJ
10.0000 mg | SUBCUTANEOUS | 1 refills | Status: DC
Start: 2023-03-05 — End: 2023-08-19
  Filled 2023-03-05: qty 6, 84d supply, fill #0
  Filled 2023-06-14: qty 2, 28d supply, fill #1
  Filled 2023-07-09 – 2023-07-15 (×4): qty 2, 28d supply, fill #2
  Filled 2023-08-14: qty 2, 28d supply, fill #3

## 2023-03-05 ENCOUNTER — Other Ambulatory Visit (HOSPITAL_COMMUNITY): Payer: Self-pay

## 2023-03-06 ENCOUNTER — Other Ambulatory Visit (HOSPITAL_COMMUNITY): Payer: Self-pay

## 2023-03-08 ENCOUNTER — Other Ambulatory Visit (HOSPITAL_COMMUNITY): Payer: Self-pay

## 2023-03-08 ENCOUNTER — Ambulatory Visit (INDEPENDENT_AMBULATORY_CARE_PROVIDER_SITE_OTHER): Payer: BC Managed Care – PPO | Admitting: Podiatry

## 2023-03-08 ENCOUNTER — Encounter: Payer: Self-pay | Admitting: Podiatry

## 2023-03-08 ENCOUNTER — Other Ambulatory Visit (INDEPENDENT_AMBULATORY_CARE_PROVIDER_SITE_OTHER): Payer: BC Managed Care – PPO

## 2023-03-08 DIAGNOSIS — D509 Iron deficiency anemia, unspecified: Secondary | ICD-10-CM | POA: Diagnosis not present

## 2023-03-08 DIAGNOSIS — M14672 Charcot's joint, left ankle and foot: Secondary | ICD-10-CM

## 2023-03-08 LAB — CBC WITH DIFFERENTIAL/PLATELET
Basophils Absolute: 0.1 10*3/uL (ref 0.0–0.1)
Basophils Relative: 0.9 % (ref 0.0–3.0)
Eosinophils Absolute: 0.4 10*3/uL (ref 0.0–0.7)
Eosinophils Relative: 4 % (ref 0.0–5.0)
HCT: 32.6 % — ABNORMAL LOW (ref 36.0–46.0)
Hemoglobin: 10.4 g/dL — ABNORMAL LOW (ref 12.0–15.0)
Lymphocytes Relative: 23.1 % (ref 12.0–46.0)
Lymphs Abs: 2.3 10*3/uL (ref 0.7–4.0)
MCHC: 32 g/dL (ref 30.0–36.0)
MCV: 84.7 fL (ref 78.0–100.0)
Monocytes Absolute: 0.6 10*3/uL (ref 0.1–1.0)
Monocytes Relative: 6 % (ref 3.0–12.0)
Neutro Abs: 6.5 10*3/uL (ref 1.4–7.7)
Neutrophils Relative %: 66 % (ref 43.0–77.0)
Platelets: 345 10*3/uL (ref 150.0–400.0)
RBC: 3.85 Mil/uL — ABNORMAL LOW (ref 3.87–5.11)
RDW: 14.9 % (ref 11.5–15.5)
WBC: 9.8 10*3/uL (ref 4.0–10.5)

## 2023-03-08 LAB — IBC + FERRITIN
Ferritin: 15.4 ng/mL (ref 10.0–291.0)
Iron: 45 ug/dL (ref 42–145)
Saturation Ratios: 9.8 % — ABNORMAL LOW (ref 20.0–50.0)
TIBC: 460.6 ug/dL — ABNORMAL HIGH (ref 250.0–450.0)
Transferrin: 329 mg/dL (ref 212.0–360.0)

## 2023-03-08 LAB — IRON: Iron: 45 ug/dL (ref 42–145)

## 2023-03-08 NOTE — Progress Notes (Signed)
Subjective:   Patient ID: Ashley Kane, female   DOB: 47 y.o.   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

## 2023-03-16 ENCOUNTER — Telehealth: Payer: Self-pay | Admitting: Gastroenterology

## 2023-03-16 ENCOUNTER — Other Ambulatory Visit: Payer: Self-pay

## 2023-03-16 DIAGNOSIS — D509 Iron deficiency anemia, unspecified: Secondary | ICD-10-CM

## 2023-03-16 MED ORDER — IRON 325 (65 FE) MG PO TABS: 325.0000 mg | ORAL_TABLET | Freq: Every day | ORAL | Status: AC

## 2023-03-16 NOTE — Telephone Encounter (Signed)
 Patient is returning your call.

## 2023-03-16 NOTE — Telephone Encounter (Signed)
 Returned call to patient and explained that she should continue daily iron and recheck lab work in 3 months per Dr Barron Alvine.  Patient agreed to plan and verbalized understanding.  No further questions.

## 2023-03-29 ENCOUNTER — Ambulatory Visit (INDEPENDENT_AMBULATORY_CARE_PROVIDER_SITE_OTHER): Payer: 59 | Admitting: Podiatry

## 2023-03-29 ENCOUNTER — Encounter: Payer: Self-pay | Admitting: Podiatry

## 2023-03-29 ENCOUNTER — Ambulatory Visit (INDEPENDENT_AMBULATORY_CARE_PROVIDER_SITE_OTHER): Payer: 59

## 2023-03-29 VITALS — Ht 62.0 in | Wt 191.0 lb

## 2023-03-29 DIAGNOSIS — M14672 Charcot's joint, left ankle and foot: Secondary | ICD-10-CM | POA: Diagnosis not present

## 2023-03-29 DIAGNOSIS — S90222A Contusion of left lesser toe(s) with damage to nail, initial encounter: Secondary | ICD-10-CM

## 2023-03-29 MED ORDER — DOXYCYCLINE HYCLATE 100 MG PO TABS: 100.0000 mg | ORAL_TABLET | Freq: Two times a day (BID) | ORAL | 1 refills | Status: AC

## 2023-03-30 NOTE — Progress Notes (Signed)
Subjective:   Patient ID: Ashley Kane, female   DOB: 48 y.o.   MRN: [id number]   HPI Patient states the swelling in the midfoot seems to be reducing significantly and I also cut my fifth digit on my left foot and it has been sore and bleeding and I did this 2 days ago   ROS      Objective:  Physical Exam  No change in neurovascular status significant reduction edema in the midfoot left with what appears to be stability of a collapse of the arch with a tear of the sulcus of the fifth digit left foot from an injury sustained 2 days ago localized mild redness of the digit     Assessment:  Appears to be improving with Charcot foot left with a laceration of the fifth digit left foot localized     Plan:  H&P x-ray reviewed discussed both conditions and I went ahead today and I did apply dressing to the left fifth digit Neosporin with placing the toe in a straight consistent position with sterile dressing that I want her to wear and placed on doxycycline twice daily with strict instructions that if any changes were to occur redness indications of infection she is to let us know immediately or may need to go to the emergency room  X-rays indicate that the midfoot does appear to have some consolidation there is some lucency but clinically it appears to be improving quite a bit we will continue immobilization for 2 more weeks and rex-ray reevaluated at

## 2023-04-12 ENCOUNTER — Encounter: Payer: Self-pay | Admitting: Podiatry

## 2023-04-12 ENCOUNTER — Ambulatory Visit (INDEPENDENT_AMBULATORY_CARE_PROVIDER_SITE_OTHER): Payer: 59 | Admitting: Podiatry

## 2023-04-12 DIAGNOSIS — M722 Plantar fascial fibromatosis: Secondary | ICD-10-CM

## 2023-04-12 DIAGNOSIS — M14672 Charcot's joint, left ankle and foot: Secondary | ICD-10-CM

## 2023-04-12 DIAGNOSIS — E1149 Type 2 diabetes mellitus with other diabetic neurological complication: Secondary | ICD-10-CM

## 2023-04-12 DIAGNOSIS — E1140 Type 2 diabetes mellitus with diabetic neuropathy, unspecified: Secondary | ICD-10-CM | POA: Diagnosis not present

## 2023-04-12 MED ORDER — TRIAMCINOLONE ACETONIDE 10 MG/ML IJ SUSP
10.0000 mg | Freq: Once | INTRAMUSCULAR | Status: AC
  Administered 2023-04-12: 10 mg via INTRA_ARTICULAR

## 2023-04-12 NOTE — Progress Notes (Unsigned)
Orthotics   Patient was present and evaluated for Custom molded foot orthotics.Left Charcot Foot deformity now stable Right foot cavus structure  Patient will benefit from CFO's to provide total contact to BIL MLA's helping to balance and distribute body weight more evenly across BIL feet helping to reduce plantar pressure and pain. Orthotic will also encourage FF / RF alignment offload to left charcot will be made  Patient was scanned today and will return for fitting upon receipt   Addison Bailey Cped, CFo, CFm

## 2023-06-02 ENCOUNTER — Telehealth: Payer: Self-pay | Admitting: Urology

## 2023-06-02 NOTE — Telephone Encounter (Signed)
 Pt is calling wanting to know if you want to see her tomorrow before her orthotic pick up tomorrow with tricia. She is not currently scheduled to see you.

## 2023-06-02 NOTE — Telephone Encounter (Signed)
 If she is having pain then she can come over and see me without a appointment

## 2023-06-03 ENCOUNTER — Ambulatory Visit (INDEPENDENT_AMBULATORY_CARE_PROVIDER_SITE_OTHER): Admitting: Podiatry

## 2023-06-03 ENCOUNTER — Ambulatory Visit: Payer: 59

## 2023-06-03 ENCOUNTER — Encounter: Payer: Self-pay | Admitting: Podiatry

## 2023-06-03 ENCOUNTER — Ambulatory Visit (INDEPENDENT_AMBULATORY_CARE_PROVIDER_SITE_OTHER)

## 2023-06-03 DIAGNOSIS — M14672 Charcot's joint, left ankle and foot: Secondary | ICD-10-CM | POA: Diagnosis not present

## 2023-06-03 NOTE — Progress Notes (Signed)
 Subjective:   Patient ID: Ashley Kane, female   DOB: 48 y.o.   MRN: [id number]   HPI Patient presents stating that she still gets warm with the foot that is improved but is still bothersome in the area.  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  Reappoint 3 weeks to see how she does with orthotics which were dispensed today and all questions answered concerning this condition and the possibilities of the future  X-rays indicate collapse midtarsal joint it is slightly worse than it was 2 months ago but not appreciably and will need to be watched on an ongoing basis

## 2023-06-04 NOTE — Progress Notes (Signed)
 Patient presents today to pick up custom molded foot orthotics, diagnosed with Charcot ankle by Dr. Charlsie Merles.   Orthotics were dispensed and fit was satisfactory. Reviewed instructions for break-in and wear. Written instructions given to patient.  Patient will follow up as needed.  Addison Bailey CPed, CFo, CFm

## 2023-06-14 ENCOUNTER — Other Ambulatory Visit (HOSPITAL_COMMUNITY): Payer: Self-pay

## 2023-06-15 ENCOUNTER — Other Ambulatory Visit (HOSPITAL_COMMUNITY): Payer: Self-pay

## 2023-06-24 ENCOUNTER — Ambulatory Visit: Admitting: Podiatry

## 2023-06-24 DIAGNOSIS — M14672 Charcot's joint, left ankle and foot: Secondary | ICD-10-CM | POA: Diagnosis not present

## 2023-06-24 DIAGNOSIS — E1140 Type 2 diabetes mellitus with diabetic neuropathy, unspecified: Secondary | ICD-10-CM | POA: Diagnosis not present

## 2023-06-24 DIAGNOSIS — E1149 Type 2 diabetes mellitus with other diabetic neurological complication: Secondary | ICD-10-CM

## 2023-06-27 NOTE — Progress Notes (Signed)
 Subjective:   Patient ID: Ashley Kane, female   DOB: 48 y.o.   MRN: [id number]   HPI Patient states that the swelling is reducing in the left midfoot and the pain is reducing also.  States she is wearing her orthotics and at this point feels relatively confident about her gait   ROS      Objective:  Physical Exam  Neurovascular status unchecked patient is doing an excellent job of controlling diabetes with midfoot collapse consistent with Charcot disease but significant reduction of the edema and erythema that was present with immobilization and now orthotics     Assessment:  Charcot deformity midfoot left that is gradually consolidating improving with some prominence on the plantar aspect of the left midfoot     Plan:  H&P reviewed final x-rays taken today.  At this point patient will gradually continue to reduce the boot but still can wear it during times of peak activity and will continue orthotics and hopefully will not require surgical intervention.  Patient will be seen back in approximate 2 months or earlier if any issues were to occur  X-rays indicate that there is collapse of the midtarsal joint left but it is localized no other pathology noted does not appear to have gotten worse in comparison to previous views

## 2023-07-09 ENCOUNTER — Other Ambulatory Visit (HOSPITAL_COMMUNITY): Payer: Self-pay

## 2023-07-14 ENCOUNTER — Other Ambulatory Visit (HOSPITAL_COMMUNITY): Payer: Self-pay

## 2023-07-15 ENCOUNTER — Other Ambulatory Visit (HOSPITAL_COMMUNITY): Payer: Self-pay

## 2023-07-17 ENCOUNTER — Other Ambulatory Visit (HOSPITAL_COMMUNITY): Payer: Self-pay

## 2023-08-14 ENCOUNTER — Other Ambulatory Visit (HOSPITAL_COMMUNITY): Payer: Self-pay

## 2023-08-16 ENCOUNTER — Other Ambulatory Visit (HOSPITAL_BASED_OUTPATIENT_CLINIC_OR_DEPARTMENT_OTHER): Payer: Self-pay

## 2023-08-16 MED ORDER — DEXCOM G7 SENSOR MISC
9 refills | Status: AC
  Filled 2023-08-16: qty 3, 30d supply, fill #0
  Filled 2023-09-07: qty 3, 30d supply, fill #1

## 2023-08-16 MED ORDER — DEXCOM G7 SENSOR MISC
9 refills | Status: DC
  Filled 2023-08-16: qty 3, 30d supply, fill #0

## 2023-08-17 ENCOUNTER — Other Ambulatory Visit (HOSPITAL_BASED_OUTPATIENT_CLINIC_OR_DEPARTMENT_OTHER): Payer: Self-pay

## 2023-08-18 ENCOUNTER — Other Ambulatory Visit (HOSPITAL_BASED_OUTPATIENT_CLINIC_OR_DEPARTMENT_OTHER): Payer: Self-pay

## 2023-08-19 ENCOUNTER — Other Ambulatory Visit (HOSPITAL_BASED_OUTPATIENT_CLINIC_OR_DEPARTMENT_OTHER): Payer: Self-pay

## 2023-08-19 MED ORDER — MOUNJARO 10 MG/0.5ML ~~LOC~~ SOAJ
10.0000 mg | SUBCUTANEOUS | 1 refills | Status: DC
  Filled 2023-09-07: qty 6, 84d supply, fill #0
  Filled 2023-12-04: qty 6, 84d supply, fill #1

## 2023-08-19 MED ORDER — DEXCOM G7 SENSOR MISC
3 refills | Status: AC
  Filled 2023-10-05: qty 9, 90d supply, fill #0

## 2023-08-25 ENCOUNTER — Ambulatory Visit (INDEPENDENT_AMBULATORY_CARE_PROVIDER_SITE_OTHER): Admitting: Podiatry

## 2023-08-25 DIAGNOSIS — E1149 Type 2 diabetes mellitus with other diabetic neurological complication: Secondary | ICD-10-CM | POA: Diagnosis not present

## 2023-08-25 DIAGNOSIS — E1140 Type 2 diabetes mellitus with diabetic neuropathy, unspecified: Secondary | ICD-10-CM | POA: Diagnosis not present

## 2023-08-25 DIAGNOSIS — M14672 Charcot's joint, left ankle and foot: Secondary | ICD-10-CM

## 2023-08-26 NOTE — Progress Notes (Signed)
 Subjective:   Patient ID: Ashley Kane, female   DOB: 48 y.o.   MRN: [id number]   HPI Patient states that she is improving quite a bit and that the swelling has really reduced.  States she is no longer wearing the boot she is wearing her orthotics and feels like she is doing much better   ROS      Objective:  Physical Exam  Neurovascular status intact with the patient found to have significant reduction of edema in the left midfoot and into the ankle from having had a Charcot diagnosed about 6 months ago.  She is no longer having to wear the boot full-time and is using her orthotics in a satisfactory way with support shoes     Assessment:  I feel good about where she is based on her clinical findings and she is very pleased to be ambulating the way she is     Plan:  H&P done and at this point we did discuss that she appears to be stable I want her to continue with the type of shoes she is wearing with orthotics not going barefoot not wearing anything flat and that as long as she remained stable clinically hopefully this is the end of the problem.  Also encouraged her to keep her sugar under excellent control with her A1c around 7 at this time.  We did not x-ray today as she is doing so well clinically I do not see at this point where we will provide value as there is nothing right now that we will be doing

## 2023-09-08 ENCOUNTER — Other Ambulatory Visit (HOSPITAL_BASED_OUTPATIENT_CLINIC_OR_DEPARTMENT_OTHER): Payer: Self-pay

## 2023-09-20 ENCOUNTER — Telehealth: Payer: Self-pay

## 2023-09-20 NOTE — Telephone Encounter (Signed)
 Pt would like a second pair of orthotics

## 2023-09-21 ENCOUNTER — Ambulatory Visit (INDEPENDENT_AMBULATORY_CARE_PROVIDER_SITE_OTHER)

## 2023-09-21 DIAGNOSIS — M722 Plantar fascial fibromatosis: Secondary | ICD-10-CM

## 2023-09-21 DIAGNOSIS — M14672 Charcot's joint, left ankle and foot: Secondary | ICD-10-CM

## 2023-09-21 DIAGNOSIS — M2141 Flat foot [pes planus] (acquired), right foot: Secondary | ICD-10-CM

## 2023-09-21 NOTE — Telephone Encounter (Signed)
 2nd pair on order can bill ins as they cover 2pr a year  [name] Cped, CFo,CFm

## 2023-09-21 NOTE — Progress Notes (Signed)
 Patient was happy with 1st pair of orthotics received in March. Re-order placed today per patient request  [name] Cped, CFo, CFm

## 2023-10-04 ENCOUNTER — Telehealth: Payer: Self-pay

## 2023-10-04 NOTE — Telephone Encounter (Signed)
 Orthotics in ASHE bin

## 2023-10-05 ENCOUNTER — Ambulatory Visit (INDEPENDENT_AMBULATORY_CARE_PROVIDER_SITE_OTHER)

## 2023-10-05 ENCOUNTER — Other Ambulatory Visit (HOSPITAL_BASED_OUTPATIENT_CLINIC_OR_DEPARTMENT_OTHER): Payer: Self-pay

## 2023-10-05 NOTE — Progress Notes (Signed)
 Patient presents today to pick up custom molded foot orthotics, diagnosed with Pes Planus and Charcot foot by Dr. Magdalen.   Orthotics were dispensed and fit was satisfactory. Reviewed instructions for break-in and wear. Written instructions given to patient.  Patient will follow up as needed.   [name] Cped, CFo, CFm

## 2023-12-06 ENCOUNTER — Other Ambulatory Visit (HOSPITAL_BASED_OUTPATIENT_CLINIC_OR_DEPARTMENT_OTHER): Payer: Self-pay

## 2024-01-03 ENCOUNTER — Other Ambulatory Visit (HOSPITAL_BASED_OUTPATIENT_CLINIC_OR_DEPARTMENT_OTHER): Payer: Self-pay

## 2024-01-03 MED ORDER — DEXCOM G7 SENSOR MISC
1.0000 | 3 refills | Status: AC
  Filled 2024-01-03 – 2024-02-02 (×2): qty 9, 90d supply, fill #0

## 2024-01-03 MED ORDER — MOUNJARO 10 MG/0.5ML ~~LOC~~ SOAJ
10.0000 mg | SUBCUTANEOUS | 1 refills | Status: AC
  Filled 2024-01-03 – 2024-02-24 (×2): qty 6, 84d supply, fill #0

## 2024-01-18 ENCOUNTER — Other Ambulatory Visit (HOSPITAL_BASED_OUTPATIENT_CLINIC_OR_DEPARTMENT_OTHER): Payer: Self-pay

## 2024-01-20 ENCOUNTER — Ambulatory Visit

## 2024-01-20 ENCOUNTER — Encounter: Payer: Self-pay | Admitting: Podiatry

## 2024-01-20 ENCOUNTER — Ambulatory Visit (INDEPENDENT_AMBULATORY_CARE_PROVIDER_SITE_OTHER): Admitting: Podiatry

## 2024-01-20 DIAGNOSIS — M14672 Charcot's joint, left ankle and foot: Secondary | ICD-10-CM

## 2024-01-22 NOTE — Progress Notes (Signed)
 Subjective:   Patient ID: Ashley Kane, female   DOB: 48 y.o.   MRN: [id number]   HPI Patient presents that she is concerned about some swelling around her left ankle and that she has had previous Charcot and she is worried that she may have further issues   ROS      Objective:  Physical Exam  Neurovascular status unchanged doing a good job of taking care of her sugar at this point with A1c within normal range with mild swelling of the plantar surface more on the lateral side but no increased edema erythema around the midfoot     Assessment:  May be a mild inflammatory process but I am not seeing current indications of a renewal of her Charcot structure     Plan:  H&P reviewed explained this to her and at this point I went ahead and I x-rayed and reviewed.  I am actually happy without the x-ray looks she may require a new orthotic but at this point we are going to put this on a watch and she is given strict instructions if any further changes I want to see her back  X-rays do indicate consolidation around the midfoot with no other indications of pathology currently with history of Charcot but it does appear to be consolidating at this point

## 2024-01-24 ENCOUNTER — Other Ambulatory Visit (HOSPITAL_BASED_OUTPATIENT_CLINIC_OR_DEPARTMENT_OTHER): Payer: Self-pay

## 2024-02-02 ENCOUNTER — Other Ambulatory Visit (HOSPITAL_BASED_OUTPATIENT_CLINIC_OR_DEPARTMENT_OTHER): Payer: Self-pay

## 2024-02-25 ENCOUNTER — Other Ambulatory Visit (HOSPITAL_BASED_OUTPATIENT_CLINIC_OR_DEPARTMENT_OTHER): Payer: Self-pay

## 2024-02-28 ENCOUNTER — Other Ambulatory Visit (HOSPITAL_BASED_OUTPATIENT_CLINIC_OR_DEPARTMENT_OTHER): Payer: Self-pay
